# Patient Record
Sex: Female | Born: 1990 | Hispanic: No | Marital: Single | Smoking: Former smoker
Health system: Southern US, Community
[De-identification: ages and names within clinical notes are randomized; demographics above are authoritative.]

## PROBLEM LIST (undated history)

## (undated) ENCOUNTER — Ambulatory Visit (HOSPITAL_BASED_OUTPATIENT_CLINIC_OR_DEPARTMENT_OTHER): Payer: Self-pay

## (undated) ENCOUNTER — Inpatient Hospital Stay (HOSPITAL_COMMUNITY): Payer: Self-pay

## (undated) DIAGNOSIS — O24419 Gestational diabetes mellitus in pregnancy, unspecified control: Secondary | ICD-10-CM

## (undated) HISTORY — PX: KNEE SURGERY: SHX244

---
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
## 2014-10-02 ENCOUNTER — Encounter (HOSPITAL_COMMUNITY): Payer: Self-pay | Admitting: *Deleted

## 2014-10-02 ENCOUNTER — Emergency Department (HOSPITAL_COMMUNITY)
Admission: EM | Admit: 2014-10-02 | Discharge: 2014-10-02 | Disposition: A | Discharge status: Home / Self Care | Payer: Self-pay | Attending: Emergency Medicine | Admitting: Emergency Medicine

## 2014-10-02 DIAGNOSIS — M778 Other enthesopathies, not elsewhere classified: Secondary | ICD-10-CM | POA: Insufficient documentation

## 2014-10-02 MED ORDER — IBUPROFEN 800 MG PO TABS
800.0000 mg | ORAL_TABLET | Freq: Three times a day (TID) | ORAL | Status: DC
Start: 2014-10-02 — End: 2015-07-24

## 2014-10-02 NOTE — ED Provider Notes (Signed)
CSN: [id number]     Arrival date & time 10/02/14  1301 History  This chart was scribed for Cheron SchaumannLeslie Adana Marik, PA-C, working with Toy CookeyMegan Docherty, MD by Chestine SporeSoijett Blue, ED Scribe. The patient was seen in room TR05C/TR05C at 2:00 PM.    Chief Complaint  Patient presents with  . Hand Pain    The history is provided by the patient. No language interpreter was used.    HPI Comments: Savannah Marsh is a [age] y.o. female who presents to the Emergency Department complaining of worsening right hand pain onset 3 weeks. She thinks that it may be carpal tunnel. She reports that she is having the most pain in her thumb. She states that she is having associated symptoms of joint swelling. She denies any other symptoms. 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 She reports that she is generally healthy and she is not on any medications. She denies being allergic to any medications. She denies smoking or drinking.    History reviewed. No pertinent past medical history. History reviewed. No pertinent past surgical history. History reviewed. No pertinent family history. History  Substance Use Topics  . Smoking status: Not on file  . Smokeless tobacco: Not on file  . Alcohol Use: No   OB History    No data available     Review of Systems  All other systems reviewed and are negative.     Allergies  Review of patient's allergies indicates no known allergies.  Home Medications   Prior to Admission medications   Not on File   LMP 09/21/2014 Physical Exam  Constitutional: She is oriented to person, place, and time. She appears well-developed and well-nourished. No distress.  HENT:  Head: Normocephalic and atraumatic.  Eyes: EOM are normal.  Neck: Neck supple. No tracheal deviation present.  Cardiovascular: Normal rate.   Pulmonary/Chest: Effort normal. No respiratory distress.  Musculoskeletal: Normal range of motion.  Right hand: Swollen thenar prominence. Pain with loading of thumb. Pain with ROM of brachial  radialis.   Neurological: She is alert and oriented to person, place, and time.  Skin: Skin is warm and dry.  Psychiatric: She has a normal mood and affect. Her behavior is normal.  Nursing note and vitals reviewed.   ED Course  Procedures (including critical care time) COORDINATION OF CARE: 2:01 PM-Discussed treatment plan which includes thumb spica, anti-inflammatory medication, referral to orthopedic with pt at bedside and pt agreed to plan.   Labs Review Labs Reviewed - No data to display  Imaging Review No results found.   EKG Interpretation None      MDM   Final diagnoses:  Tendonitis of wrist, right    Wrist splint Ibuprofen  Follow up with Dr. Izora Ribasoley in 1 week if pain persist I personally performed the services described in this documentation, which was scribed in my presence. The recorded information has been reviewed and is accurate.    Lonia SkinnerLeslie K Salineno North[place], PA-C 10/02/14 1428  Toy CookeyMegan Docherty, MD 10/05/14 1004

## 2014-10-02 NOTE — Discharge Instructions (Signed)
Thumb Sprain °Your exam shows you have a sprained thumb. This means the ligaments around the joint have been torn. Thumb sprains usually take 3-6 weeks to heal. However, severe, unstable sprains may need to be fixed surgically. Sometimes a small piece of bone is pulled off by the ligament. If this is not treated properly, a sprained thumb can lead to a painful, weak joint. Treatment helps reduce pain and shortens the period of disability. °The thumb, and often the wrist, must remain splinted for the first 2-4 weeks to protect the joint. Keep your hand elevated and apply ice packs frequently to the injured area (20-30 minutes every 2-3 hours) for the next 2-4 days. This helps reduce swelling and control pain. Pain medicine may also be used for several days. Motion and strengthening exercises may later be prescribed for the joint to return to normal function. Be sure to see your doctor for follow-up because your thumb joint may require further support with splints, bandages or tape. Please see your doctor or go to the emergency room right away if you have increased pain despite proper treatment, or a numb, cold, or pale thumb. °Document Released: 09/14/2004 Document Revised: 10/30/2011 Document Reviewed: 08/08/2008 °ExitCare® Patient Information ©2015 [employer]. This information is not intended to replace advice given to you by your health care provider. Make sure you discuss any questions you have with your health care provider. ° °

## 2014-10-02 NOTE — ED Notes (Signed)
Pt reports having pain to right hand for extended amount of time, thought it was carpal tunnel. Denies injury.

## 2015-07-24 ENCOUNTER — Encounter (HOSPITAL_COMMUNITY): Payer: Self-pay | Admitting: *Deleted

## 2015-07-24 ENCOUNTER — Inpatient Hospital Stay (HOSPITAL_COMMUNITY): Payer: Medicaid Other

## 2015-07-24 ENCOUNTER — Inpatient Hospital Stay (HOSPITAL_COMMUNITY)
Admission: AD | Admit: 2015-07-24 | Discharge: 2015-07-24 | Disposition: A | Discharge status: Home / Self Care | Payer: Medicaid Other | Source: Ambulatory Visit | Attending: Obstetrics & Gynecology | Admitting: Obstetrics & Gynecology

## 2015-07-24 DIAGNOSIS — O3680X0 Pregnancy with inconclusive fetal viability, not applicable or unspecified: Secondary | ICD-10-CM

## 2015-07-24 DIAGNOSIS — R1032 Left lower quadrant pain: Secondary | ICD-10-CM | POA: Insufficient documentation

## 2015-07-24 DIAGNOSIS — R109 Unspecified abdominal pain: Secondary | ICD-10-CM | POA: Diagnosis not present

## 2015-07-24 DIAGNOSIS — Z87891 Personal history of nicotine dependence: Secondary | ICD-10-CM | POA: Insufficient documentation

## 2015-07-24 DIAGNOSIS — O9989 Other specified diseases and conditions complicating pregnancy, childbirth and the puerperium: Secondary | ICD-10-CM | POA: Diagnosis not present

## 2015-07-24 DIAGNOSIS — Z3A01 Less than 8 weeks gestation of pregnancy: Secondary | ICD-10-CM | POA: Insufficient documentation

## 2015-07-24 DIAGNOSIS — O219 Vomiting of pregnancy, unspecified: Secondary | ICD-10-CM | POA: Diagnosis not present

## 2015-07-24 LAB — URINALYSIS, ROUTINE W REFLEX MICROSCOPIC
Bilirubin Urine: NEGATIVE
Glucose, UA: NEGATIVE mg/dL
Hgb urine dipstick: NEGATIVE
Ketones, ur: NEGATIVE mg/dL
Leukocytes, UA: NEGATIVE
Nitrite: NEGATIVE
Protein, ur: 100 mg/dL — AB
Specific Gravity, Urine: >1.03 — ABNORMAL HIGH (ref 1.005–1.030)
pH: 6 (ref 5.0–8.0)

## 2015-07-24 LAB — CBC
HCT: 40.2 % (ref 36.0–46.0)
Hemoglobin: 13.2 g/dL (ref 12.0–15.0)
MCH: 26.4 pg (ref 26.0–34.0)
MCHC: 32.8 g/dL (ref 30.0–36.0)
MCV: 80.4 fL (ref 78.0–100.0)
Platelets: 351 10*3/uL (ref 150–400)
RBC: 5 MIL/uL (ref 3.87–5.11)
RDW: 13.9 % (ref 11.5–15.5)
WBC: 11.1 10*3/uL — ABNORMAL HIGH (ref 4.0–10.5)

## 2015-07-24 LAB — URINE MICROSCOPIC-ADD ON

## 2015-07-24 LAB — HCG, QUANTITATIVE, PREGNANCY: hCG, Beta Chain, Quant, S: 14631 m[IU]/mL — ABNORMAL HIGH (ref <5)

## 2015-07-24 LAB — ABO/RH: ABO/RH(D): A POS

## 2015-07-24 LAB — POCT PREGNANCY, URINE: Preg Test, Ur: POSITIVE — AB

## 2015-07-24 MED ORDER — PROMETHAZINE HCL 25 MG PO TABS
25.0000 mg | ORAL_TABLET | Freq: Once | ORAL | Status: AC
  Administered 2015-07-24: 25 mg via ORAL
  Filled 2015-07-24: qty 1

## 2015-07-24 MED ORDER — PROMETHAZINE HCL 25 MG PO TABS: 25.0000 mg | ORAL_TABLET | Freq: Four times a day (QID) | ORAL | Status: DC | PRN

## 2015-07-24 NOTE — MAU Note (Addendum)
Threw up, was yellow and noted some blood.  Has been having some stomach cramps, took Tylenol and the cramps seem to have gotten worse.  +[facility] week ago[facility]

## 2015-07-24 NOTE — MAU Provider Note (Signed)
History     CSN: [id number]  Arrival date and time: 07/24/15 1500   First Provider Initiated Contact with Patient 07/24/15 1610      Chief Complaint  Patient presents with  . Abdominal Cramping  . Emesis  . Possible Pregnancy   HPI  Savannah Marsh 24 y.o.  Presents to the MAU with abdominal pain on her left lower side and nausea. Denies vaginal bleeding. Positive pregnancy test last week  History reviewed. No pertinent past medical history.  Past Surgical History  Procedure Laterality Date  . Knee surgery      History reviewed. No pertinent family history.  Social History  Substance Use Topics  . Smoking status: Former Smoker    Quit date: 07/16/2015  . Smokeless tobacco: None  . Alcohol Use: No    Allergies: No Known Allergies  Prescriptions prior to admission  Medication Sig Dispense Refill Last Dose  . acetaminophen (TYLENOL) 500 MG tablet Take 500 mg by mouth every 6 (six) hours as needed for mild pain or headache.   07/24/2015 at 1415    Review of Systems  Gastrointestinal: Positive for nausea and abdominal pain.  All other systems reviewed and are negative.  Physical Exam   Blood pressure 121/69, pulse 70, temperature 99.5 F (37.5 C), temperature source Oral, resp. rate 18, last menstrual period 06/11/2015.  Physical Exam  Nursing note and vitals reviewed. Constitutional: She is oriented to person, place, and time. She appears well-developed and well-nourished. No distress.  HENT:  Head: Normocephalic and atraumatic.  Neck: Normal range of motion.  Cardiovascular: Normal rate and regular rhythm.   Respiratory: Effort normal and breath sounds normal. No respiratory distress.  GI: Soft. There is no tenderness.  Musculoskeletal: Normal range of motion.  Neurological: She is alert and oriented to person, place, and time.  Skin: Skin is warm and dry.  Psychiatric: She has a normal mood and affect. Her behavior is normal. Judgment and thought content  normal.   Results for orders placed or performed during the hospital encounter of 07/24/15 (from the past 24 hour(s))  Urinalysis, Routine w reflex microscopic (not at [facility])     Status: Abnormal   Collection Time: 07/24/15  3:25 PM  Result Value Ref Range   Color, Urine YELLOW YELLOW   APPearance CLEAR CLEAR   Specific Gravity, Urine >1.030 (H) 1.005 - 1.030   pH 6.0 5.0 - 8.0   Glucose, UA NEGATIVE NEGATIVE mg/dL   Hgb urine dipstick NEGATIVE NEGATIVE   Bilirubin Urine NEGATIVE NEGATIVE   Ketones, ur NEGATIVE NEGATIVE mg/dL   Protein, ur 147100 (A) NEGATIVE mg/dL   Nitrite NEGATIVE NEGATIVE   Leukocytes, UA NEGATIVE NEGATIVE  Urine microscopic-add on     Status: Abnormal   Collection Time: 07/24/15  3:25 PM  Result Value Ref Range   Squamous Epithelial / LPF 0-5 (A) NONE SEEN   WBC, UA 0-5 0 - 5 WBC/hpf   RBC / HPF 0-5 0 - 5 RBC/hpf   Bacteria, UA FEW (A) NONE SEEN   Urine-Other MUCOUS PRESENT   Pregnancy, urine POC     Status: Abnormal   Collection Time: 07/24/15  4:00 PM  Result Value Ref Range   Preg Test, Ur POSITIVE (A) NEGATIVE  CBC     Status: Abnormal   Collection Time: 07/24/15  4:23 PM  Result Value Ref Range   WBC 11.1 (H) 4.0 - 10.5 K/uL   RBC 5.00 3.87 - 5.11 MIL/uL   Hemoglobin 13.2 12.0 -  15.0 g/dL   HCT 16.1 09.6 - 04.5 %   MCV 80.4 78.0 - 100.0 fL   MCH 26.4 26.0 - 34.0 pg   MCHC 32.8 30.0 - 36.0 g/dL   RDW 40.9 81.1 - 91.4 %   Platelets 351 150 - 400 K/uL  hCG, quantitative, pregnancy     Status: Abnormal   Collection Time: 07/24/15  4:23 PM  Result Value Ref Range   hCG, Beta Chain, Quant, S 14631 (H) <5 mIU/mL  ABO/Rh     Status: None (Preliminary result)   Collection Time: 07/24/15  4:23 PM  Result Value Ref Range   ABO/RH(D) A POS     MAU Course  Procedures  MDM Pt expereincing nausea and vomiting and left sided abdominal pain. Will evaluate [place]; Positive FHT's-108.   Assessment and Plan  Pregnancy of unknown location Nausea and vomiting  of pregnancy Rx: Phenergan Discharge to home [facility] [place] 07/24/2015, 4:11 PM

## 2015-07-24 NOTE — Discharge Instructions (Signed)
Prenatal Care [facility]    [facility]  [facility]  [phone]     Phone: [phone]          [facility]                      [facility]   [place]     Phone: [phone]  Phone: [phone]         [facility]     Phone: [phone]  Phone: [phone]           [facility] [place]                hone: [phone]  Phone: [phone]         [facility] [address] Ceo      Phone: [phone]  Phone: [phone]         [facility] Dept.                Phone: [phone]  [facility]   [phone]    Family [address] [place])          Phone: [phone] [facility]   Phone: [phone] Medications in Pregnancy   Acne: Benzoyl Peroxide Salicylic Acid  Backache/Headache: Tylenol: 2 regular strength every 4 hours OR              2 Extra strength every 6 hours  Colds/Coughs/Allergies: Benadryl (alcohol free) 25 mg every 6 hours as needed Breath right strips Claritin Cepacol throat lozenges Chloraseptic throat spray Cold-Eeze- up to three times per day Cough drops, alcohol free Flonase (by prescription only) Guaifenesin Mucinex Robitussin DM (plain only, alcohol free) Saline nasal spray/drops Sudafed (pseudoephedrine) & Actifed ** use only after [redacted] weeks gestation and if you do not have high blood pressure Tylenol Vicks Vaporub Zinc lozenges Zyrtec   Constipation: Colace Ducolax suppositories Fleet enema Glycerin suppositories Metamucil Milk of magnesia Miralax Senokot Smooth move tea  Diarrhea: Kaopectate Imodium A-D  *NO pepto Bismol  Hemorrhoids: Anusol Anusol HC Preparation H Tucks  Indigestion: Tums Maalox Mylanta Zantac  Pepcid  Insomnia: Benadryl (alcohol free)  every 6 hours as needed Tylenol PM Unisom,  no Gelcaps  Leg Cramps: Tums MagGel  Nausea/Vomiting:  Bonine Dramamine Emetrol Ginger extract Sea bands Meclizine  Nausea medication to take during pregnancy:  Unisom (doxylamine succinate 25 mg tablets) Take one tablet daily at bedtime. If symptoms are not adequately controlled, the dose can be increased to a maximum recommended dose of two tablets daily (1/2 tablet in the morning, 1/2 tablet mid-afternoon and one at bedtime). Vitamin B6  tablets. Take one tablet twice a day (up to 200 mg per day).  Skin Rashes: Aveeno products Benadryl cream or  every 6 hours as needed Calamine Lotion 1% cortisone cream  Yeast infection: Gyne-lotrimin 7 Monistat 7   **If taking multiple medications, please check labels to avoid duplicating the same active ingredients **take medication as directed on the label ** Do not exceed 4000 mg of tylenol in 24 hours **Do not take medications that contain aspirin or ibuprofen   Morning Sickness Morning sickness is when you feel sick to your stomach (nauseous) during pregnancy. This nauseous feeling may or may not come with vomiting. It often occurs in the morning but can be a problem any time of day. Morning sickness is most common  during the first trimester, but it may continue throughout pregnancy. While morning sickness is unpleasant, it is usually harmless unless you develop severe and continual vomiting (hyperemesis gravidarum). This condition requires more intense treatment.  CAUSES  The cause of morning sickness is not completely known but seems to be related to normal hormonal changes that occur in pregnancy. RISK FACTORS You are at greater risk if you:  Experienced nausea or vomiting before your pregnancy.  Had morning sickness during a previous pregnancy.  Are pregnant with more than one baby, such as twins. TREATMENT  Do not use any medicines (prescription, over-the-counter, or herbal) for morning sickness without first  talking to your health care provider. Your health care provider may prescribe or recommend:  Vitamin B6 supplements.  Anti-nausea medicines.  The herbal medicine ginger. HOME CARE INSTRUCTIONS   Only take over-the-counter or prescription medicines as directed by your health care provider.  Taking multivitamins before getting pregnant can prevent or decrease the severity of morning sickness in most women.  Eat a piece of dry toast or unsalted crackers before getting out of bed in the morning.  Eat five or six small meals a day.  Eat dry and bland foods (rice, baked potato). Foods high in carbohydrates are often helpful.  Do not drink liquids with your meals. Drink liquids between meals.  Avoid greasy, fatty, and spicy foods.  Get someone to cook for you if the smell of any food causes nausea and vomiting.  If you feel nauseous after taking prenatal vitamins, take the vitamins at night or with a snack.  Snack on protein foods (nuts, yogurt, cheese) between meals if you are hungry.  Eat unsweetened gelatins for desserts.  Wearing an acupressure wristband (worn for sea sickness) may be helpful.  Acupuncture may be helpful.  Do not smoke.  Get a humidifier to keep the air in your house free of odors.  Get plenty of fresh air. SEEK MEDICAL CARE IF:   Your home remedies are not working, and you need medicine.  You feel dizzy or lightheaded.  You are losing weight. SEEK IMMEDIATE MEDICAL CARE IF:   You have persistent and uncontrolled nausea and vomiting.  You pass out (faint). MAKE SURE YOU:  Understand these instructions.  Will watch your condition.  Will get help right away if you are not doing well or get worse.   This information is not intended to replace advice given to you by your health care provider. Make sure you discuss any questions you have with your health care provider.   Document Released: 09/28/2006 Document Revised: 08/12/2013 Document  Reviewed: 01/22/2013 Elsevier Interactive Patient Education [employer]2016 Elsevier Inc.

## 2015-07-27 ENCOUNTER — Inpatient Hospital Stay (HOSPITAL_COMMUNITY)
Admission: AD | Admit: 2015-07-27 | Discharge: 2015-07-27 | Disposition: A | Discharge status: Home / Self Care | Payer: Medicaid Other | Source: Ambulatory Visit | Attending: Family Medicine | Admitting: Family Medicine

## 2015-07-27 ENCOUNTER — Encounter (HOSPITAL_COMMUNITY): Payer: Self-pay

## 2015-07-27 DIAGNOSIS — N390 Urinary tract infection, site not specified: Secondary | ICD-10-CM

## 2015-07-27 DIAGNOSIS — O209 Hemorrhage in early pregnancy, unspecified: Secondary | ICD-10-CM | POA: Diagnosis not present

## 2015-07-27 DIAGNOSIS — O2341 Unspecified infection of urinary tract in pregnancy, first trimester: Secondary | ICD-10-CM | POA: Diagnosis not present

## 2015-07-27 DIAGNOSIS — Z3A01 Less than 8 weeks gestation of pregnancy: Secondary | ICD-10-CM | POA: Diagnosis not present

## 2015-07-27 DIAGNOSIS — Z87891 Personal history of nicotine dependence: Secondary | ICD-10-CM | POA: Diagnosis not present

## 2015-07-27 DIAGNOSIS — R109 Unspecified abdominal pain: Secondary | ICD-10-CM | POA: Diagnosis present

## 2015-07-27 DIAGNOSIS — O26851 Spotting complicating pregnancy, first trimester: Secondary | ICD-10-CM

## 2015-07-27 LAB — URINE MICROSCOPIC-ADD ON
RBC / HPF: NONE SEEN RBC/hpf (ref 0–5)
WBC, UA: NONE SEEN WBC/hpf (ref 0–5)

## 2015-07-27 LAB — URINALYSIS, ROUTINE W REFLEX MICROSCOPIC
Bilirubin Urine: NEGATIVE
Glucose, UA: NEGATIVE mg/dL
Hgb urine dipstick: NEGATIVE
Ketones, ur: 15 mg/dL — AB
Leukocytes, UA: NEGATIVE
Nitrite: NEGATIVE
Protein, ur: 30 mg/dL — AB
Specific Gravity, Urine: 1.02 (ref 1.005–1.030)
pH: 6.5 (ref 5.0–8.0)

## 2015-07-27 MED ORDER — NITROFURANTOIN MONOHYD MACRO 100 MG PO CAPS: 100.0000 mg | ORAL_CAPSULE | Freq: Two times a day (BID) | ORAL | Status: AC

## 2015-07-27 NOTE — MAU Provider Note (Signed)
History     CSN: [id number]  Arrival date and time: 07/27/15 1431   First Provider Initiated Contact with Patient 07/27/15 1537      Chief Complaint  Patient presents with  . Vaginal Bleeding  . Abdominal Pain   HPI  Savannah Marsh 24 y.o. G1P0 [id number] presents to the MAU with c/o seeing some blood one time when she went to the bathroom and wiped. She denies any abdominal pain. Pt has confirmed IUP. She states she was just nervous and wanted to be checked out.  History reviewed. No pertinent past medical history.  Past Surgical History  Procedure Laterality Date  . Knee surgery      History reviewed. No pertinent family history.  Social History  Substance Use Topics  . Smoking status: Former Smoker    Quit date: 07/16/2015  . Smokeless tobacco: None  . Alcohol Use: No    Allergies: No Known Allergies  Prescriptions prior to admission  Medication Sig Dispense Refill Last Dose  . acetaminophen (TYLENOL) 500 MG tablet Take 500 mg by mouth every 6 (six) hours as needed for mild pain or headache.   07/24/2015 at 1415  . promethazine (PHENERGAN) 25 MG tablet Take 1 tablet (25 mg total) by mouth every 6 (six) hours as needed for nausea or vomiting. 30 tablet 0     Review of Systems  Constitutional: Negative for fever.  Genitourinary:       Small vaginal bleeding  All other systems reviewed and are negative.  Physical Exam   Blood pressure 138/75, pulse 76, temperature 98.5 F (36.9 C), temperature source Oral, resp. rate 18, last menstrual period 06/11/2015.  Physical Exam  Nursing note and vitals reviewed. Constitutional: She is oriented to person, place, and time. She appears well-developed and well-nourished.  HENT:  Head: Normocephalic and atraumatic.  Neck: Normal range of motion.  Cardiovascular: Normal rate and regular rhythm.   Respiratory: Effort normal and breath sounds normal. No respiratory distress.  GI: Soft.  Musculoskeletal: Normal range of motion.   Neurological: She is alert and oriented to person, place, and time.  Skin: Skin is warm and dry.  Psychiatric: She has a normal mood and affect. Her behavior is normal. Judgment and thought content normal.   Results for orders placed or performed during the hospital encounter of 07/27/15 (from the past 24 hour(s))  Urinalysis, Routine w reflex microscopic (not [facility])     Status: Abnormal   Collection Time: 07/27/15  2:53 PM  Result Value Ref Range   Color, Urine YELLOW YELLOW   APPearance CLEAR CLEAR   Specific Gravity, Urine 1.020 1.005 - 1.030   pH 6.5 5.0 - 8.0   Glucose, UA NEGATIVE NEGATIVE mg/dL   Hgb urine dipstick NEGATIVE NEGATIVE   Bilirubin Urine NEGATIVE NEGATIVE   Ketones, ur 15 (A) NEGATIVE mg/dL   Protein, ur 30 (A) NEGATIVE mg/dL   Nitrite NEGATIVE NEGATIVE   Leukocytes, UA NEGATIVE NEGATIVE  Urine microscopic-add on     Status: Abnormal   Collection Time: 07/27/15  2:53 PM  Result Value Ref Range   Squamous Epithelial / LPF 0-5 (A) NONE SEEN   WBC, UA NONE SEEN 0 - 5 WBC/hpf   RBC / HPF NONE SEEN 0 - 5 RBC/hpf   Bacteria, UA RARE (A) NONE SEEN    MAU Course  Procedures  MDM Will treat pt for suspected UTI. Will send urine off to culture. Macrobid 100mg  bid x 7 d. Bleeding precautions given.  Assessment and Plan  Vaginal Bleeding UTI  Macrobid  BID x 7 d  Discharge to home  [facility] 07/27/2015, 3:51 PM

## 2015-07-27 NOTE — Discharge Instructions (Signed)
Pregnancy and Urinary Tract Infection °A urinary tract infection (UTI) is a bacterial infection of the urinary tract. Infection of the urinary tract can include the ureters, kidneys (pyelonephritis), bladder (cystitis), and urethra (urethritis). All pregnant women should be screened for bacteria in the urinary tract. Identifying and treating a UTI will decrease the risk of preterm labor and developing more serious infections in both the mother and baby. °CAUSES °Bacteria germs cause almost all UTIs.  °RISK FACTORS °Many factors can increase your chances of getting a UTI during pregnancy. These include: °· Having a short urethra. °· Poor toilet and hygiene habits. °· Sexual intercourse. °· Blockage of urine along the urinary tract. °· Problems with the pelvic muscles or nerves. °· Diabetes. °· Obesity. °· Bladder problems after having several children. °· Previous history of UTI. °SIGNS AND SYMPTOMS  °· Pain, burning, or a stinging feeling when urinating. °· Suddenly feeling the need to urinate right away (urgency). °· Loss of bladder control (urinary incontinence). °· Frequent urination, more than is common with pregnancy. °· Lower abdominal or back discomfort. °· Cloudy urine. °· Blood in the urine (hematuria). °· Fever.  °When the kidneys are infected, the symptoms may be: °· Back pain. °· Flank pain on the right side more so than the left. °· Fever. °· Chills. °· Nausea. °· Vomiting. °DIAGNOSIS  °A urinary tract infection is usually diagnosed through urine tests. Additional tests and procedures are sometimes done. These may include: °· Ultrasound exam of the kidneys, ureters, bladder, and urethra. °· Looking in the bladder with a lighted tube (cystoscopy). °TREATMENT °Typically, UTIs can be treated with antibiotic medicines.  °HOME CARE INSTRUCTIONS  °· Only take over-the-counter or prescription medicines as directed by your health care provider. If you were prescribed antibiotics, take them as directed. Finish  them even if you start to feel better. °· Drink enough fluids to keep your urine clear or pale yellow. °· Do not have sexual intercourse until the infection is gone and your health care provider says it is okay. °· Make sure you are tested for UTIs throughout your pregnancy. These infections often come back.  °Preventing a UTI in the Future °· Practice good toilet habits. Always wipe from front to back. Use the tissue only once. °· Do not hold your urine. Empty your bladder as soon as possible when the urge comes. °· Do not douche or use deodorant sprays. °· Wash with soap and warm water around the genital area and the anus. °· Empty your bladder before and after sexual intercourse. °· Wear underwear with a cotton crotch. °· Avoid caffeine and carbonated drinks. They can irritate the bladder. °· Drink cranberry juice or take cranberry pills. This may decrease the risk of getting a UTI. °· Do not drink alcohol. °· Keep all your appointments and tests as scheduled.  °SEEK MEDICAL CARE IF:  °· Your symptoms get worse. °· You are still having fevers 2 or more days after treatment begins. °· You have a rash. °· You feel that you are having problems with medicines prescribed. °· You have abnormal vaginal discharge. °SEEK IMMEDIATE MEDICAL CARE IF:  °· You have back or flank pain. °· You have chills. °· You have blood in your urine. °· You have nausea and vomiting. °· You have contractions of your uterus. °· You have a gush of fluid from the vagina. °MAKE SURE YOU: °· Understand these instructions.   °· Will watch your condition.   °· Will get help right away if you are not doing   well or get worse.   °  °This information is not intended to replace advice given to you by your health care provider. Make sure you discuss any questions you have with your health care provider. °  °Document Released: 12/02/2010 Document Revised: 05/28/2013 Document Reviewed: 03/06/2013 °Elsevier Interactive Patient Education ©2016 Elsevier  Inc. ° ° °Vaginal Bleeding During Pregnancy, First Trimester °A small amount of bleeding (spotting) from the vagina is relatively common in early pregnancy. It usually stops on its own. Various things may cause bleeding or spotting in early pregnancy. Some bleeding may be related to the pregnancy, and some may not. In most cases, the bleeding is normal and is not a problem. However, bleeding can also be a sign of something serious. Be sure to tell your health care provider about any vaginal bleeding right away. °Some possible causes of vaginal bleeding during the first trimester include: °· Infection or inflammation of the cervix. °· Growths (polyps) on the cervix. °· Miscarriage or threatened miscarriage. °· Pregnancy tissue has developed outside of the uterus and in a fallopian tube (tubal pregnancy). °· Tiny cysts have developed in the uterus instead of pregnancy tissue (molar pregnancy). °HOME CARE INSTRUCTIONS  °Watch your condition for any changes. The following actions may help to lessen any discomfort you are feeling: °· Follow your health care provider's instructions for limiting your activity. If your health care provider orders bed rest, you may need to stay in bed and only get up to use the bathroom. However, your health care provider may allow you to continue light activity. °· If needed, make plans for someone to help with your regular activities and responsibilities while you are on bed rest. °· Keep track of the number of pads you use each day, how often you change pads, and how soaked (saturated) they are. Write this down. °· Do not use tampons. Do not douche. °· Do not have sexual intercourse or orgasms until approved by your health care provider. °· If you pass any tissue from your vagina, save the tissue so you can show it to your health care provider. °· Only take over-the-counter or prescription medicines as directed by your health care provider. °· Do not take aspirin because it can make you  bleed. °· Keep all follow-up appointments as directed by your health care provider. °SEEK MEDICAL CARE IF: °· You have any vaginal bleeding during any part of your pregnancy. °· You have cramps or labor pains. °· You have a fever, not controlled by medicine. °SEEK IMMEDIATE MEDICAL CARE IF:  °· You have severe cramps in your back or belly (abdomen). °· You pass large clots or tissue from your vagina. °· Your bleeding increases. °· You feel light-headed or weak, or you have fainting episodes. °· You have chills. °· You are leaking fluid or have a gush of fluid from your vagina. °· You pass out while having a bowel movement. °MAKE SURE YOU: °· Understand these instructions. °· Will watch your condition. °· Will get help right away if you are not doing well or get worse. °  °This information is not intended to replace advice given to you by your health care provider. Make sure you discuss any questions you have with your health care provider. °  °Document Released: 05/17/2005 Document Revised: 08/12/2013 Document Reviewed: 04/14/2013 °Elsevier Interactive Patient Education ©2016 Elsevier Inc. ° °

## 2015-07-27 NOTE — MAU Note (Signed)
Pt saw blood with wiping today, having LLQ pain.

## 2015-07-29 LAB — CULTURE, OB URINE
Culture: NO GROWTH
Special Requests: NORMAL

## 2015-08-16 ENCOUNTER — Encounter (HOSPITAL_COMMUNITY): Payer: Self-pay | Admitting: *Deleted

## 2015-08-16 ENCOUNTER — Emergency Department (HOSPITAL_COMMUNITY)
Admission: EM | Admit: 2015-08-16 | Discharge: 2015-08-16 | Disposition: A | Discharge status: Home / Self Care | Payer: Medicaid Other | Attending: Emergency Medicine | Admitting: Emergency Medicine

## 2015-08-16 DIAGNOSIS — Z87891 Personal history of nicotine dependence: Secondary | ICD-10-CM | POA: Diagnosis not present

## 2015-08-16 DIAGNOSIS — O209 Hemorrhage in early pregnancy, unspecified: Secondary | ICD-10-CM | POA: Diagnosis present

## 2015-08-16 DIAGNOSIS — O23511 Infections of cervix in pregnancy, first trimester: Secondary | ICD-10-CM | POA: Insufficient documentation

## 2015-08-16 DIAGNOSIS — N72 Inflammatory disease of cervix uteri: Secondary | ICD-10-CM

## 2015-08-16 DIAGNOSIS — O200 Threatened abortion: Secondary | ICD-10-CM | POA: Insufficient documentation

## 2015-08-16 LAB — URINALYSIS, ROUTINE W REFLEX MICROSCOPIC
Bilirubin Urine: NEGATIVE
GLUCOSE, UA: NEGATIVE mg/dL
Hgb urine dipstick: NEGATIVE
KETONES UR: 40 mg/dL — AB
LEUKOCYTES UA: NEGATIVE
NITRITE: NEGATIVE
PH: 6 (ref 5.0–8.0)
PROTEIN: 100 mg/dL — AB
Specific Gravity, Urine: 1.022 (ref 1.005–1.030)

## 2015-08-16 LAB — ABO/RH: ABO/RH(D): A POS

## 2015-08-16 LAB — URINE MICROSCOPIC-ADD ON

## 2015-08-16 LAB — HCG, QUANTITATIVE, PREGNANCY: HCG, BETA CHAIN, QUANT, S: 88402 m[IU]/mL — AB (ref <5)

## 2015-08-16 LAB — WET PREP, GENITAL
Clue Cells Wet Prep HPF POC: NONE SEEN
Sperm: NONE SEEN
Trich, Wet Prep: NONE SEEN
YEAST WET PREP: NONE SEEN

## 2015-08-16 MED ORDER — LIDOCAINE HCL (PF) 1 % IJ SOLN
2.0000 mL | Freq: Once | INTRAMUSCULAR | Status: AC
  Administered 2015-08-16: 2 mL

## 2015-08-16 MED ORDER — AZITHROMYCIN 250 MG PO TABS
1000.0000 mg | ORAL_TABLET | Freq: Once | ORAL | Status: AC
  Administered 2015-08-16: 1000 mg via ORAL
  Filled 2015-08-16: qty 4

## 2015-08-16 MED ORDER — CEFTRIAXONE SODIUM 250 MG IJ SOLR
250.0000 mg | Freq: Once | INTRAMUSCULAR | Status: AC
  Administered 2015-08-16: 250 mg via INTRAMUSCULAR
  Filled 2015-08-16: qty 250

## 2015-08-16 MED ORDER — LIDOCAINE HCL (PF) 1 % IJ SOLN
INTRAMUSCULAR | Status: AC
  Filled 2015-08-16: qty 5

## 2015-08-16 NOTE — ED Provider Notes (Signed)
CSN: [id number]     Arrival date & time 08/16/15  4098 History   First MD Initiated Contact with Patient 08/16/15 1106     Chief Complaint  Patient presents with  . Vaginal Bleeding     (Consider location/radiation/quality/duration/timing/severity/associated sxs/prior Treatment) HPI   24 year old female who is currently [redacted] weeks pregnant presents with vaginal bleeding. Patient reports yesterday she noticed some mild vaginal spotting with mild abdominal cramping. This morning she continues to have vaginal bleeding and abdominal cramping which concerns her. She mentioned her bleeding is scant. She does report mild lightheadedness. She did had another episode of vaginal bleeding 5 weeks ago in which [facility] Patient reports she had an ultrasound performed and no evidence of ectopic pregnancy. She was reassured that bleeding during the first trimester is not unusual. However, patient would like to be reevaluated. She also notice a painful "knot" on her left breast yesterday, coincide with abdominal cramping. This is the first pregnancy. She denies any recent injury. No fever, back pain, dysuria, hematuria, or rash. Denies any vaginal discharge.  History reviewed. No pertinent past medical history. Past Surgical History  Procedure Laterality Date  . Knee surgery     No family history on file. Social History  Substance Use Topics  . Smoking status: Former Smoker    Quit date: 07/16/2015  . Smokeless tobacco: None  . Alcohol Use: No   OB History    Gravida Para Term Preterm AB TAB SAB Ectopic Multiple Living   1              Review of Systems  All other systems reviewed and are negative.     Allergies  Review of patient's allergies indicates no known allergies.  Home Medications   Prior to Admission medications   Medication Sig Start Date End Date Taking? Authorizing Provider  acetaminophen (TYLENOL) 500 MG tablet Take 500 mg by mouth every 6 (six) hours  as needed for mild pain or headache.    Historical Provider, MD  promethazine (PHENERGAN) 25 MG tablet Take 1 tablet (25 mg total) by mouth every 6 (six) hours as needed for nausea or vomiting. 07/24/15   Lori A Clemmons, CNM   BP 132/80 mmHg  Pulse 94  Temp(Src) 98.6 F (37 C) (Oral)  Resp 16  SpO2 100%  LMP 06/11/2015 Physical Exam  Constitutional: She appears well-developed and well-nourished. No distress.  HENT:  Head: Atraumatic.  Eyes: Conjunctivae are normal.  Neck: Neck supple.  Abdominal: Soft. There is no tenderness (mild left lower quadrant tenderness without guarding or rebound tenderness.).  Genitourinary:  Chaperone present during exam. No inguinal lymphadenopathy or inguinal hernia noted. Normal external genitalia. Mild discomfort with speculum insertion. Mild friable tissue at the cervical os which is closed. Yellow discharge noted coming from the os. No bleeding noted. On bimanual examination patient does have left adnexal tenderness but no cervical motion tenderness. Non-gravid abdomen.  Neurological: She is alert.  Skin: No rash noted.  Psychiatric: She has a normal mood and affect.  Nursing note and vitals reviewed.   ED Course  Procedures (including critical care time) Labs Review Labs Reviewed  WET PREP, GENITAL - Abnormal; Notable for the following:    WBC, Wet Prep HPF POC MANY (*)    All other components within normal limits  HCG, QUANTITATIVE, PREGNANCY - Abnormal; Notable for the following:    hCG, Beta Chain, Quant, [place] [address] (*)    All other components within  normal limits  URINALYSIS, ROUTINE W REFLEX MICROSCOPIC (NOT AT [facility]) - Abnormal; Notable for the following:    APPearance CLOUDY (*)    Ketones, ur 40 (*)    Protein, ur 100 (*)    All other components within normal limits  URINE MICROSCOPIC-ADD ON - Abnormal; Notable for the following:    Squamous Epithelial / LPF 6-30 (*)    Bacteria, UA RARE (*)    Casts HYALINE CASTS (*)    All other  components within normal limits  RPR  HIV ANTIBODY (ROUTINE TESTING)  ABO/RH  GC/CHLAMYDIA PROBE AMP ([place]) NOT AT [facility]      US OB Comp Less 14 Wks   Status: Final result       PACS Images     Show images for US OB Comp Less 14 Wks     Study Result     CLINICAL DATA: Left lower quadrant abdominal pain. Positive pregnancy test. First-trimester pregnancy.  EXAM: OBSTETRIC <14 WK US AND TRANSVAGINAL OB US  TECHNIQUE: Both transabdominal and transvaginal ultrasound examinations were performed for complete evaluation of the gestation as well as the maternal uterus, adnexal regions, and pelvic cul-de-sac. Transvaginal technique was performed to assess early pregnancy.  COMPARISON: None.  FINDINGS: Intrauterine gestational sac: Visualized/normal in shape.  Yolk sac: Present  Embryo: Present  Cardiac Activity: Present  Heart Rate: 103 bpm  CRL: 3.1 mm 5 w 6 d US EDC: 03/19/2016  Maternal uterus/adnexae: Trace free fluid is present. Uterus and adnexa are otherwise unremarkable.  IMPRESSION: 1. Single intrauterine pregnancy with an estimated gestational age of [redacted] weeks and 6 days. This is consistent with the estimated gestational age by LMP. 2. No complicating features. 3. Uterus and adnexa are otherwise unremarkable.   Electronically Signed  By: Marin Robertshristopher Mattern M.D.  On: 07/24/2015 17:50     Imaging Review No results found. I have personally reviewed and evaluated these images and lab results as part of my medical decision-making.   EKG Interpretation None      MDM   Final diagnoses:  Threatened miscarriage in early pregnancy  Acute cervicitis    BP 123/71 mmHg  Pulse 88  Temp(Src) 98.6 F (37 C) (Oral)  Resp 18  SpO2 99%  LMP 06/11/2015   11:24 AM Patient here with low abdominal cramping and vaginal bleeding during her first trimester. This is suggestive of threatened  miscarriage. Workup initiated.  3:06 PM  Wet prep demonstrated many WBC. Patient does have left adnexal tenderness and therefore suspect acute cervicitis. Rocephin and Zithromax given. Urine shows 40 ketones but no signs of UTI. HCG Quant is [id number].  Patient symptoms suggestive of threatened miscarriage secondary to acute cervicitis. I encouraged [facility] management of her condition. Recommend staying hydrated and avoid sexual activities at this time. Work note provided as requested. Return precaution discussed.  Fayrene HelperBowie Natara Monfort, PA-C 08/16/15 1510  Linwood DibblesJon Knapp, MD 08/16/15 [phone]

## 2015-08-16 NOTE — Discharge Instructions (Signed)
Cervicitis Cervicitis is a soreness and puffiness (inflammation) of the cervix.  HOME CARE  Do not have sex (intercourse) until your doctor says it is okay.  Do not have sex until your partner is treated or as told by your doctor.  Take your antibiotic medicine as told. Finish it even if you start to feel better. GET HELP IF:   Your symptoms that brought you to the doctor come back.  You have a fever. MAKE SURE YOU:   Understand these instructions.  Will watch your condition.  Will get help right away if you are not doing well or get worse.   This information is not intended to replace advice given to you by your health care provider. Make sure you discuss any questions you have with your health care provider.   Document Released: 05/16/2008 Document Revised: 08/12/2013 Document Reviewed: 01/29/2013 Elsevier Interactive Patient Education [employer]2016 Elsevier Inc.   Threatened Miscarriage A threatened miscarriage is when you have vaginal bleeding during your first 20 weeks of pregnancy but the pregnancy has not ended. Your doctor will do tests to make sure you are still pregnant. The cause of the bleeding may not be known. This condition does not mean your pregnancy will end. It does increase the risk of it ending (complete miscarriage). HOME CARE   Make sure you keep all your doctor visits for prenatal care.  Get plenty of rest.  Do not have sex or use tampons if you have vaginal bleeding.  Do not douche.  Do not smoke or use drugs.  Do not drink alcohol.  Avoid caffeine. GET HELP IF:  You have light bleeding from your vagina.  You have belly pain or cramping.  You have a fever. GET HELP RIGHT AWAY IF:   You have heavy bleeding from your vagina.  You have clots of blood coming from your vagina.  You have bad pain or cramps in your low back or belly.  You have fever, chills, and bad belly pain. MAKE SURE YOU:   Understand these instructions.  Will watch your  condition.  Will get help right away if you are not doing well or get worse.   This information is not intended to replace advice given to you by your health care provider. Make sure you discuss any questions you have with your health care provider.   Document Released: 07/20/2008 Document Revised: 08/12/2013 Document Reviewed: 06/03/2013 Elsevier Interactive Patient Education [employer]2016 Elsevier Inc.

## 2015-08-16 NOTE — ED Notes (Signed)
Pt reports vaginal bleeding since last night and being [redacted] weeks pregnant. Pt states that she has a "knot" in the center of her chest as well.

## 2015-08-17 LAB — RPR: RPR Ser Ql: NONREACTIVE

## 2015-08-17 LAB — HIV ANTIBODY (ROUTINE TESTING W REFLEX): HIV Screen 4th Generation wRfx: NONREACTIVE

## 2015-08-20 LAB — GC/CHLAMYDIA PROBE AMP (~~LOC~~) NOT AT ARMC
Chlamydia: NEGATIVE
Neisseria Gonorrhea: NEGATIVE

## 2015-08-22 NOTE — L&D Delivery Note (Signed)
Delivery Note At 3:20 AM a viable and healthy female was delivered via Vaginal, Spontaneous Delivery (Presentation: Right Occiput Anterior).  APGAR: 8, 9; weight 6 lb 12.1 oz (3065 g).   Placenta status: Intact, Spontaneous.  Cord: 3 vessels with the following complications: None.    Anesthesia: Epidural  Episiotomy: None Lacerations: 1st degree Suture Repair: 3.0 monocryl Est. Blood Loss (mL): 300  Mom to postpartum.  Baby to Couplet care / Skin to Skin.  [name] is a 25 y.o. female G1P1001 with IUP at 9179w5d admitted for IOL for preeclampsia and A2GDM.  She progressed with augmentation to complete and pushed less than 30 minutes to deliver.  Cord clamping delayed by several minutes then clamped by CNM and cut by FOB.  Placenta intact and spontaneous, bleeding minimal.  First degree laceration repaired without difficulty. Mom and baby stable prior to transfer to postpartum. She plans on breastfeeding.  [name], [name] 03/08/2016, 6:24 AM

## 2015-08-31 ENCOUNTER — Other Ambulatory Visit: Payer: Self-pay | Admitting: Certified Nurse Midwife

## 2015-08-31 ENCOUNTER — Ambulatory Visit (INDEPENDENT_AMBULATORY_CARE_PROVIDER_SITE_OTHER): Payer: Medicaid Other | Admitting: Certified Nurse Midwife

## 2015-08-31 ENCOUNTER — Encounter: Payer: Self-pay | Admitting: Certified Nurse Midwife

## 2015-08-31 VITALS — BP 123/83 | HR 96 | Temp 98.3°F | Ht 62.0 in | Wt 205.0 lb

## 2015-08-31 DIAGNOSIS — O219 Vomiting of pregnancy, unspecified: Secondary | ICD-10-CM | POA: Diagnosis not present

## 2015-08-31 DIAGNOSIS — Z3401 Encounter for supervision of normal first pregnancy, first trimester: Secondary | ICD-10-CM | POA: Diagnosis not present

## 2015-08-31 LAB — POCT URINALYSIS DIPSTICK
BILIRUBIN UA: NEGATIVE
Glucose, UA: NEGATIVE
Leukocytes, UA: NEGATIVE
NITRITE UA: NEGATIVE
RBC UA: NEGATIVE
Spec Grav, UA: 1.02
UROBILINOGEN UA: NEGATIVE
pH, UA: 5

## 2015-08-31 LAB — TSH: TSH: 1.962 u[IU]/mL (ref 0.350–4.500)

## 2015-08-31 MED ORDER — PROMETHAZINE HCL 25 MG PO TABS: 25.0000 mg | ORAL_TABLET | Freq: Four times a day (QID) | ORAL | Status: DC | PRN

## 2015-08-31 MED ORDER — VITAFOL GUMMIES 3.33-0.333-34.8 MG PO CHEW: 3.0000 | CHEWABLE_TABLET | Freq: Every day | ORAL | Status: DC

## 2015-08-31 MED ORDER — DOXYLAMINE-PYRIDOXINE 10-10 MG PO TBEC: DELAYED_RELEASE_TABLET | ORAL | Status: DC

## 2015-08-31 MED ORDER — METOCLOPRAMIDE HCL 10 MG PO TABS: 10.0000 mg | ORAL_TABLET | Freq: Three times a day (TID) | ORAL | Status: DC

## 2015-08-31 NOTE — Addendum Note (Signed)
Addended by: Henriette CombsHATTON, ANDREA L on: 08/31/2015 05:06 PM   Modules accepted: Orders

## 2015-08-31 NOTE — Progress Notes (Signed)
Subjective:    Savannah Marsh is being seen today for her first obstetrical visit.  This is not a planned pregnancy. She is at 2351w4d gestation. Her obstetrical history is significant for obesity. Relationship with FOB: significant other, living together. Patient does intend to breast feed. Pregnancy history fully reviewed.  The information documented in the HPI was reviewed and verified.  Menstrual History: OB History    Gravida Para Term Preterm AB TAB SAB Ectopic Multiple Living   1               Menarche age: 2614-25 years of age.   Patient's last menstrual period was 06/11/2015.    Past Medical History  Diagnosis Date  . Medical history non-contributory     Past Surgical History  Procedure Laterality Date  . Knee surgery       (Not in a hospital admission) No Known Allergies  Social History  Substance Use Topics  . Smoking status: Former Smoker    Quit date: 07/16/2015  . Smokeless tobacco: Never Used  . Alcohol Use: No    Family History  Problem Relation Age of Onset  . Stroke Father   . Diabetes Father      Review of Systems Constitutional: negative for weight loss Gastrointestinal: negative for vomiting Genitourinary:negative for genital lesions and vaginal discharge and dysuria Musculoskeletal:negative for back pain Behavioral/Psych: negative for abusive relationship, depression, illegal drug usage and tobacco use    Objective:    BP 123/83 mmHg  Pulse 96  Temp(Src) 98.3 F (36.8 C)  Ht 5\' 2"  (1.575 m)  Wt 205 lb (92.987 kg)  BMI 37.49 kg/m2  LMP 06/11/2015 General Appearance:    Alert, cooperative, no distress, appears stated age  Head:    Normocephalic, without obvious abnormality, atraumatic  Eyes:    PERRL, conjunctiva/corneas clear, EOM's intact, fundi    benign, both eyes  Ears:    Normal TM's and external ear canals, both ears  Nose:   Nares normal, septum midline, mucosa normal, no drainage    or sinus tenderness  Throat:   Lips, mucosa, and  tongue normal; teeth and gums normal  Neck:   Supple, symmetrical, trachea midline, no adenopathy;    thyroid:  no enlargement/tenderness/nodules; no carotid   bruit or JVD  Back:     Symmetric, no curvature, ROM normal, no CVA tenderness  Lungs:     Clear to auscultation bilaterally, respirations unlabored  Chest Wall:    No tenderness or deformity   Heart:    Regular rate and rhythm, S1 and S2 normal, no murmur, rub   or gallop  Breast Exam:    No tenderness, masses, or nipple abnormality  Abdomen:     Soft, non-tender, bowel sounds active all four quadrants,    no masses, no organomegaly  Genitalia:    Normal female without lesion, discharge or tenderness  Extremities:   Extremities normal, atraumatic, no cyanosis or edema  Pulses:   2+ and symmetric all extremities  Skin:   Skin color, texture, turgor normal, no rashes or lesions  Lymph nodes:   Cervical, supraclavicular, and axillary nodes normal  Neurologic:   CNII-XII intact, normal strength, sensation and reflexes    throughout      Lab Review Urine pregnancy test Labs reviewed yes Radiologic studies reviewed yes Assessment:    Pregnancy at 5251w4d weeks    Plan:      Prenatal vitamins.  Counseling provided regarding continued use of seat belts, cessation of  alcohol consumption, smoking or use of illicit drugs; infection precautions i.e., influenza/TDAP immunizations, toxoplasmosis,CMV, parvovirus, listeria and varicella; workplace safety, exercise during pregnancy; routine dental care, safe medications, sexual activity, hot tubs, saunas, pools, travel, caffeine use, fish and methlymercury, potential toxins, hair treatments, varicose veins Weight gain recommendations per IOM guidelines reviewed: underweight/BMI< 18.5--> gain 28 - 40 lbs; normal weight/BMI 18.5 - 24.9--> gain 25 - 35 lbs; overweight/BMI 25 - 29.9--> gain 15 - 25 lbs; obese/BMI >30->gain  11 - 20 lbs Problem list reviewed and updated. FIRST/CF mutation  testing/NIPT/QUAD SCREEN/fragile X/Ashkenazi Jewish population testing/Spinal muscular atrophy discussed: requested. Role of ultrasound in pregnancy discussed; fetal survey: requested. Amniocentesis discussed: not indicated. VBAC calculator score: VBAC consent form provided Meds ordered this encounter  Medications  . Prenatal Multivit-Min-Fe-FA (PRENATAL VITAMINS PO)    Sig: Take by mouth.   Orders Placed This Encounter  Procedures  . Culture, OB Urine  . Obstetric panel  . HIV antibody  . Hemoglobinopathy evaluation  . Varicella zoster antibody, IgG  . VITAMIN D 25 Hydroxy (Vit-D Deficiency, Fractures)  . TSH  . POCT urinalysis dipstick    Follow up in 4 weeks. 50% of 30 min visit spent on counseling and coordination of care.

## 2015-08-31 NOTE — Addendum Note (Signed)
Addended by: Henriette CombsHATTON, Soua Lenk L on: 08/31/2015 04:57 PM   Modules accepted: Orders

## 2015-09-01 LAB — VARICELLA ZOSTER ANTIBODY, IGG: VARICELLA IGG: 187.7 {index} — AB (ref <135.00)

## 2015-09-01 LAB — OBSTETRIC PANEL
ANTIBODY SCREEN: NEGATIVE
BASOS ABS: 0 10*3/uL (ref 0.0–0.1)
Basophils Relative: 0 % (ref 0–1)
EOS ABS: 0.1 10*3/uL (ref 0.0–0.7)
EOS PCT: 1 % (ref 0–5)
HCT: 41 % (ref 36.0–46.0)
HEMOGLOBIN: 13.3 g/dL (ref 12.0–15.0)
Hepatitis B Surface Ag: NEGATIVE
LYMPHS PCT: 20 % (ref 12–46)
Lymphs Abs: 2 10*3/uL (ref 0.7–4.0)
MCH: 25.7 pg — AB (ref 26.0–34.0)
MCHC: 32.4 g/dL (ref 30.0–36.0)
MCV: 79.2 fL (ref 78.0–100.0)
MPV: 8.5 fL — AB (ref 8.6–12.4)
Monocytes Absolute: 0.5 10*3/uL (ref 0.1–1.0)
Monocytes Relative: 5 % (ref 3–12)
NEUTROS PCT: 74 % (ref 43–77)
Neutro Abs: 7.3 10*3/uL (ref 1.7–7.7)
Platelets: 383 10*3/uL (ref 150–400)
RBC: 5.18 MIL/uL — AB (ref 3.87–5.11)
RDW: 13.6 % (ref 11.5–15.5)
Rh Type: POSITIVE
Rubella: 0.62 Index (ref <0.90)
WBC: 9.8 10*3/uL (ref 4.0–10.5)

## 2015-09-01 LAB — PAP IG W/ RFLX HPV ASCU

## 2015-09-01 LAB — CULTURE, OB URINE
COLONY COUNT: NO GROWTH
ORGANISM ID, BACTERIA: NO GROWTH

## 2015-09-01 LAB — VITAMIN D 25 HYDROXY (VIT D DEFICIENCY, FRACTURES): Vit D, 25-Hydroxy: 16 ng/mL — ABNORMAL LOW (ref 30–100)

## 2015-09-01 LAB — HIV ANTIBODY (ROUTINE TESTING W REFLEX): HIV 1&2 Ab, 4th Generation: NONREACTIVE

## 2015-09-02 LAB — HEMOGLOBINOPATHY EVALUATION
HEMOGLOBIN OTHER: 0 %
HGB F QUANT: 0 % (ref 0.0–2.0)
HGB S QUANTITAION: 0 %
Hgb A2 Quant: 2.5 % (ref 2.2–3.2)
Hgb A: 97.5 % (ref 96.8–97.8)

## 2015-09-03 LAB — HUMAN PAPILLOMAVIRUS, HIGH RISK: HPV DNA High Risk: DETECTED — AB

## 2015-09-05 LAB — SURESWAB, VAGINOSIS/VAGINITIS PLUS
Atopobium vaginae: 5.7 Log (cells/mL)
C. GLABRATA, DNA: NOT DETECTED
C. PARAPSILOSIS, DNA: NOT DETECTED
C. TROPICALIS, DNA: NOT DETECTED
C. albicans, DNA: NOT DETECTED
C. trachomatis RNA, TMA: NOT DETECTED
Gardnerella vaginalis: 7.8 Log (cells/mL)
LACTOBACILLUS SPECIES: NOT DETECTED Log (cells/mL)
MEGASPHAERA SPECIES: 7.5 Log (cells/mL)
N. gonorrhoeae RNA, TMA: NOT DETECTED
T. VAGINALIS RNA, QL TMA: NOT DETECTED

## 2015-09-08 ENCOUNTER — Other Ambulatory Visit: Payer: Self-pay | Admitting: Certified Nurse Midwife

## 2015-09-08 DIAGNOSIS — B9689 Other specified bacterial agents as the cause of diseases classified elsewhere: Secondary | ICD-10-CM

## 2015-09-08 DIAGNOSIS — N760 Acute vaginitis: Principal | ICD-10-CM

## 2015-09-08 MED ORDER — METRONIDAZOLE 0.75 % VA GEL: 1.0000 | Freq: Two times a day (BID) | VAGINAL | Status: DC

## 2015-09-10 LAB — HPV TYPE 16/18
HPV Genotype, 16: DETECTED — AB
HPV Genotype, 18: NOT DETECTED

## 2015-09-15 ENCOUNTER — Other Ambulatory Visit: Payer: Self-pay | Admitting: Certified Nurse Midwife

## 2015-09-28 ENCOUNTER — Ambulatory Visit (INDEPENDENT_AMBULATORY_CARE_PROVIDER_SITE_OTHER): Payer: Medicaid Other | Admitting: Certified Nurse Midwife

## 2015-09-28 VITALS — BP 134/83 | HR 107 | Temp 98.6°F | Wt 202.0 lb

## 2015-09-28 DIAGNOSIS — Z3482 Encounter for supervision of other normal pregnancy, second trimester: Secondary | ICD-10-CM

## 2015-09-28 LAB — POCT URINALYSIS DIPSTICK
BILIRUBIN UA: NEGATIVE
GLUCOSE UA: NEGATIVE
Leukocytes, UA: NEGATIVE
Nitrite, UA: NEGATIVE
RBC UA: NEGATIVE
SPEC GRAV UA: 1.015
UROBILINOGEN UA: NEGATIVE
pH, UA: 7

## 2015-09-28 NOTE — Progress Notes (Signed)
  Subjective:    Savannah Marsh is a 25 y.o. female being seen today for her obstetrical visit. She is at [redacted]w[redacted]d gestation. Patient reports: no complaints.  Problem List Items Addressed This Visit    None    Visit Diagnoses    Supervision of other normal pregnancy, antepartum, second trimester    -  Primary    Relevant Orders    AFP, Quad Screen    [place] MFM OB COMP + 14 WK    POCT urinalysis dipstick (Completed)      Patient Active Problem List   Diagnosis Date Noted  . Supervision of normal first pregnancy in first trimester 08/31/2015    Objective:     BP 134/83 mmHg  Pulse 107  Temp(Src) 98.6 F (37 C)  Wt 202 lb (91.627 kg)  LMP 06/11/2015 Uterine Size: Below umbilicus   FHR: 150's  Assessment:    Pregnancy @ [redacted]w[redacted]d  weeks Doing well    Plan:    Problem list reviewed and updated. Labs reviewed.  Follow up in 4 weeks. FIRST/CF mutation testing/NIPT/QUAD SCREEN/fragile X/Ashkenazi Jewish population testing/Spinal muscular atrophy discussed: ordered. Role of ultrasound in pregnancy discussed; fetal survey: ordered. Amniocentesis discussed: not indicated. 50% of 15 minute visit spent on counseling and coordination of care.

## 2015-09-29 LAB — AFP, QUAD SCREEN
AFP: 40.7 ng/mL
Age Alone: 1:1100 {titer}
CURR GEST AGE: 15.2 wks.days
Down Syndrome Scr Risk Est: <1:38500 {titer}
HCG, Total: 38.13 IU/mL
INH: 122.7 pg/mL
Interpretation-AFP: NEGATIVE
MOM FOR HCG: 0.93
MOM FOR INH: 0.78
MoM for AFP: 1.7
Open Spina bifida: NEGATIVE
Tri 18 Scr Risk Est: NEGATIVE
Trisomy 18 (Edward) Syndrome Interp.: 1:114000 {titer}
UE3 MOM: 1.01
uE3 Value: 0.65 ng/mL

## 2015-10-26 ENCOUNTER — Ambulatory Visit (INDEPENDENT_AMBULATORY_CARE_PROVIDER_SITE_OTHER): Payer: Medicaid Other | Admitting: Certified Nurse Midwife

## 2015-10-26 ENCOUNTER — Ambulatory Visit (HOSPITAL_COMMUNITY)
Admission: RE | Admit: 2015-10-26 | Discharge: 2015-10-26 | Disposition: A | Discharge status: Home / Self Care | Payer: Medicaid Other | Source: Ambulatory Visit | Attending: Certified Nurse Midwife | Admitting: Certified Nurse Midwife

## 2015-10-26 ENCOUNTER — Other Ambulatory Visit: Payer: Self-pay | Admitting: Certified Nurse Midwife

## 2015-10-26 VITALS — BP 121/75 | HR 91 | Temp 98.3°F | Wt 206.0 lb

## 2015-10-26 DIAGNOSIS — Z1389 Encounter for screening for other disorder: Secondary | ICD-10-CM

## 2015-10-26 DIAGNOSIS — O99212 Obesity complicating pregnancy, second trimester: Secondary | ICD-10-CM | POA: Insufficient documentation

## 2015-10-26 DIAGNOSIS — Z3A19 19 weeks gestation of pregnancy: Secondary | ICD-10-CM | POA: Diagnosis not present

## 2015-10-26 DIAGNOSIS — Z36 Encounter for antenatal screening of mother: Secondary | ICD-10-CM | POA: Diagnosis present

## 2015-10-26 DIAGNOSIS — Z331 Pregnant state, incidental: Secondary | ICD-10-CM

## 2015-10-26 DIAGNOSIS — Z3482 Encounter for supervision of other normal pregnancy, second trimester: Secondary | ICD-10-CM

## 2015-10-26 DIAGNOSIS — Z3402 Encounter for supervision of normal first pregnancy, second trimester: Secondary | ICD-10-CM

## 2015-10-27 ENCOUNTER — Other Ambulatory Visit: Payer: Self-pay | Admitting: Certified Nurse Midwife

## 2015-10-27 NOTE — Progress Notes (Signed)
Subjective:    Savannah Marsh is a 25 y.o. female being seen today for her obstetrical visit. She is at 5923w5d gestation. Patient reports: no complaints . Fetal movement: normal.  Problem List Items Addressed This Visit    None    Visit Diagnoses    Encounter for supervision of normal first pregnancy in second trimester    -  Primary    Relevant Orders    POCT urinalysis dipstick      Patient Active Problem List   Diagnosis Date Noted  . Supervision of normal first pregnancy in first trimester 08/31/2015   Objective:    BP 121/75 mmHg  Pulse 91  Temp(Src) 98.3 F (36.8 C)  Wt 206 lb (93.441 kg)  LMP 06/11/2015 FHT: 145 BPM  Uterine Size: at U     Assessment:    Pregnancy @ 3723w5d    Plan:    OBGCT: discussed. Signs and symptoms of preterm labor: discussed.  Labs, problem list reviewed and updated 2 hr GTT planned Follow up in 4 weeks.

## 2015-11-17 ENCOUNTER — Inpatient Hospital Stay (HOSPITAL_COMMUNITY)
Admission: AD | Admit: 2015-11-17 | Discharge: 2015-11-17 | Disposition: A | Discharge status: Home / Self Care | Payer: Medicaid Other | Source: Ambulatory Visit | Attending: Obstetrics | Admitting: Obstetrics

## 2015-11-17 ENCOUNTER — Encounter (HOSPITAL_COMMUNITY): Payer: Self-pay | Admitting: *Deleted

## 2015-11-17 DIAGNOSIS — Z79899 Other long term (current) drug therapy: Secondary | ICD-10-CM | POA: Insufficient documentation

## 2015-11-17 DIAGNOSIS — Z87891 Personal history of nicotine dependence: Secondary | ICD-10-CM | POA: Insufficient documentation

## 2015-11-17 DIAGNOSIS — Z3A22 22 weeks gestation of pregnancy: Secondary | ICD-10-CM

## 2015-11-17 DIAGNOSIS — Z823 Family history of stroke: Secondary | ICD-10-CM | POA: Diagnosis not present

## 2015-11-17 DIAGNOSIS — O26892 Other specified pregnancy related conditions, second trimester: Secondary | ICD-10-CM | POA: Diagnosis not present

## 2015-11-17 DIAGNOSIS — R112 Nausea with vomiting, unspecified: Secondary | ICD-10-CM | POA: Diagnosis present

## 2015-11-17 DIAGNOSIS — K529 Noninfective gastroenteritis and colitis, unspecified: Secondary | ICD-10-CM

## 2015-11-17 DIAGNOSIS — Z833 Family history of diabetes mellitus: Secondary | ICD-10-CM | POA: Diagnosis not present

## 2015-11-17 DIAGNOSIS — O99612 Diseases of the digestive system complicating pregnancy, second trimester: Secondary | ICD-10-CM | POA: Diagnosis not present

## 2015-11-17 LAB — CBC WITH DIFFERENTIAL/PLATELET
BASOS ABS: 0 10*3/uL (ref 0.0–0.1)
BASOS PCT: 0 %
EOS ABS: 0.1 10*3/uL (ref 0.0–0.7)
Eosinophils Relative: 1 %
HCT: 36 % (ref 36.0–46.0)
HEMOGLOBIN: 11.8 g/dL — AB (ref 12.0–15.0)
Lymphocytes Relative: 18 %
Lymphs Abs: 1.8 10*3/uL (ref 0.7–4.0)
MCH: 25.6 pg — ABNORMAL LOW (ref 26.0–34.0)
MCHC: 32.8 g/dL (ref 30.0–36.0)
MCV: 78.1 fL (ref 78.0–100.0)
Monocytes Absolute: 0.5 10*3/uL (ref 0.1–1.0)
Monocytes Relative: 5 %
NEUTROS PCT: 76 %
Neutro Abs: 7.7 10*3/uL (ref 1.7–7.7)
Platelets: 313 10*3/uL (ref 150–400)
RBC: 4.61 MIL/uL (ref 3.87–5.11)
RDW: 13.8 % (ref 11.5–15.5)
WBC: 10 10*3/uL (ref 4.0–10.5)

## 2015-11-17 LAB — URINALYSIS, ROUTINE W REFLEX MICROSCOPIC
Bilirubin Urine: NEGATIVE
GLUCOSE, UA: NEGATIVE mg/dL
Hgb urine dipstick: NEGATIVE
KETONES UR: 40 mg/dL — AB
LEUKOCYTES UA: NEGATIVE
NITRITE: NEGATIVE
PROTEIN: NEGATIVE mg/dL
Specific Gravity, Urine: 1.02 (ref 1.005–1.030)
pH: 7 (ref 5.0–8.0)

## 2015-11-17 MED ORDER — SODIUM CHLORIDE 0.9 % IV SOLN
8.0000 mg | Freq: Once | INTRAVENOUS | Status: AC
  Administered 2015-11-17: 8 mg via INTRAVENOUS
  Filled 2015-11-17: qty 4

## 2015-11-17 MED ORDER — PROMETHAZINE HCL 25 MG PO TABS: 25.0000 mg | ORAL_TABLET | Freq: Four times a day (QID) | ORAL | Status: DC | PRN

## 2015-11-17 MED ORDER — LACTATED RINGERS IV BOLUS (SEPSIS)
1000.0000 mL | Freq: Once | INTRAVENOUS | Status: AC
  Administered 2015-11-17: 1000 mL via INTRAVENOUS

## 2015-11-17 MED ORDER — FAMOTIDINE IN NACL 20-0.9 MG/50ML-% IV SOLN
20.0000 mg | Freq: Once | INTRAVENOUS | Status: AC
  Administered 2015-11-17: 20 mg via INTRAVENOUS
  Filled 2015-11-17: qty 50

## 2015-11-17 MED ORDER — ONDANSETRON 8 MG PO TBDP: 8.0000 mg | ORAL_TABLET | Freq: Three times a day (TID) | ORAL | Status: DC | PRN

## 2015-11-17 NOTE — MAU Provider Note (Signed)
History     CSN: [id number]  Arrival date and time: 11/17/15 [id number]   First Provider Initiated Contact with Patient 11/17/15 1141       Chief Complaint  Patient presents with  . Nausea  . Dizziness   HPI  Savannah Marsh is a 25 y.o. G1P0 at 7157w5d who presents for n/v/d and episodes of dizziness. Reports symptoms began yesterday. Has vomited once today and continues to be nauseated. Had 2 watery stools today. Frontal headache all day that is worse with standing. Rates headache 6/10. Has not treated. Occasional dizziness when up and moving around. Denies abdominal pain, vaginal bleeding, fever, sick contacts, or syncope. Positive fetal movement.    OB History    Gravida Para Term Preterm AB TAB SAB Ectopic Multiple Living   1               Past Medical History  Diagnosis Date  . Medical history non-contributory     Past Surgical History  Procedure Laterality Date  . Knee surgery      Family History  Problem Relation Age of Onset  . Stroke Father   . Diabetes Father     Social History  Substance Use Topics  . Smoking status: Former Smoker    Quit date: 07/16/2015  . Smokeless tobacco: Never Used  . Alcohol Use: No    Allergies: No Known Allergies  Prescriptions prior to admission  Medication Sig Dispense Refill Last Dose  . Doxylamine-Pyridoxine (DICLEGIS) 10-10 MG TBEC Take 1 tablet with breakfast and lunch.  Take 2 tablets at bedtime. (Patient not taking: Reported on 09/28/2015) 100 tablet 4 Not Taking  . metoCLOPramide (REGLAN) 10 MG tablet Take 1 tablet (10 mg total) by mouth 4 (four) times daily -  before meals and at bedtime. (Patient not taking: Reported on 09/28/2015) 120 tablet 3 Not Taking  . Prenatal Vit-Fe Phos-FA-Omega (VITAFOL GUMMIES) 3.33-0.333-34.8 MG CHEW Chew 3 tablets by mouth daily. 90 tablet 12 Taking  . promethazine (PHENERGAN) 25 MG tablet Take 1 tablet (25 mg total) by mouth every 6 (six) hours as needed for nausea or vomiting. (Patient not taking:  Reported on 09/28/2015) 30 tablet 0 Not Taking    Review of Systems  Constitutional: Negative for fever and chills.  HENT: Negative for sore throat.   Eyes: Negative.   Respiratory: Negative.   Cardiovascular: Negative.   Gastrointestinal: Positive for heartburn, nausea, vomiting and diarrhea. Negative for abdominal pain, constipation and blood in stool.  Genitourinary: Negative.   Neurological: Positive for dizziness and headaches. Negative for loss of consciousness.   Physical Exam   Blood pressure 119/52, pulse 67, temperature 98.2 F (36.8 C), temperature source Oral, resp. rate 18, weight 205 lb 6.4 oz (93.169 kg), last menstrual period 06/11/2015.  Physical Exam  Nursing note and vitals reviewed. Constitutional: She is oriented to person, place, and time. She appears well-developed and well-nourished. No distress.  HENT:  Head: Normocephalic and atraumatic.  Eyes: Conjunctivae are normal. Right eye exhibits no discharge. Left eye exhibits no discharge. No scleral icterus.  Neck: Normal range of motion.  Cardiovascular: Normal rate, regular rhythm and normal heart sounds.   No murmur heard. Respiratory: Effort normal and breath sounds normal. No respiratory distress. She has no wheezes.  GI: Soft. Bowel sounds are normal. There is no tenderness. There is no rebound.  Musculoskeletal: Normal range of motion.  Neurological: She is alert and oriented to person, place, and time.  Skin: Skin is warm and dry.  She is not diaphoretic.  Psychiatric: She has a normal mood and affect. Her behavior is normal. Judgment and thought content normal.    MAU Course  Procedures Results for orders placed or performed during the hospital encounter of 11/17/15 (from the past 24 hour(s))  Urinalysis, Routine w reflex microscopic (not at [facility])     Status: Abnormal   Collection Time: 11/17/15 10:50 AM  Result Value Ref Range   Color, Urine YELLOW YELLOW   APPearance CLEAR CLEAR   Specific  Gravity, Urine 1.020 1.005 - 1.030   pH 7.0 5.0 - 8.0   Glucose, UA NEGATIVE NEGATIVE mg/dL   Hgb urine dipstick NEGATIVE NEGATIVE   Bilirubin Urine NEGATIVE NEGATIVE   Ketones, ur 40 (A) NEGATIVE mg/dL   Protein, ur NEGATIVE NEGATIVE mg/dL   Nitrite NEGATIVE NEGATIVE   Leukocytes, UA NEGATIVE NEGATIVE  CBC with Differential     Status: Abnormal   Collection Time: 11/17/15 12:00 PM  Result Value Ref Range   WBC 10.0 4.0 - 10.5 K/uL   RBC 4.61 3.87 - 5.11 MIL/uL   Hemoglobin 11.8 (L) 12.0 - 15.0 g/dL   HCT 86.5 78.4 - 69.6 %   MCV 78.1 78.0 - 100.0 fL   MCH 25.6 (L) 26.0 - 34.0 pg   MCHC 32.8 30.0 - 36.0 g/dL   RDW 29.5 28.4 - 13.2 %   Platelets 313 150 - 400 K/uL   Neutrophils Relative % 76 %   Neutro Abs 7.7 1.7 - 7.7 K/uL   Lymphocytes Relative 18 %   Lymphs Abs 1.8 0.7 - 4.0 K/uL   Monocytes Relative 5 %   Monocytes Absolute 0.5 0.1 - 1.0 K/uL   Eosinophils Relative 1 %   Eosinophils Absolute 0.1 0.0 - 0.7 K/uL   Basophils Relative 0 %   Basophils Absolute 0.0 0.0 - 0.1 K/uL    MDM FHT 140 by doppler IV LR fluid bolus Zofran 8 mg IV Pepcid 20 mg IV Pt reports improvement in symptoms  Assessment and Plan  A: 1. Acute gastroenteritis     P: Discharge home Rx zofran & phenergan Bland diet for diarrhea, advance diet as tolerated Slow position changes Good hand hygiene Discussed reasons to return  Judeth Horn 11/17/2015, 11:41 AM

## 2015-11-17 NOTE — MAU Note (Signed)
Last night, stomach was really cramping. Felt like she was going to pass out.  Kind of felt like morning sickness. Did throw up.  This morning when she got up, she felt dizzy and nausous. No longer feels crampy

## 2015-11-17 NOTE — Discharge Instructions (Signed)

## 2015-11-23 ENCOUNTER — Ambulatory Visit (INDEPENDENT_AMBULATORY_CARE_PROVIDER_SITE_OTHER): Payer: Medicaid Other | Admitting: Certified Nurse Midwife

## 2015-11-23 VITALS — BP 111/71 | HR 93 | Temp 98.6°F | Wt 207.0 lb

## 2015-11-23 DIAGNOSIS — Z3402 Encounter for supervision of normal first pregnancy, second trimester: Secondary | ICD-10-CM

## 2015-11-23 LAB — POCT URINALYSIS DIPSTICK
Glucose, UA: NEGATIVE
Leukocytes, UA: NEGATIVE
Nitrite, UA: NEGATIVE
PH UA: 6.5
RBC UA: NEGATIVE
Spec Grav, UA: 1.015
Urobilinogen, UA: 0.2

## 2015-11-23 NOTE — Progress Notes (Signed)
Subjective:    Savannah Marsh is a 25 y.o. female being seen today for her obstetrical visit. She is at 3335w4d gestation. Patient reports: no complaints . Fetal movement: normal.  Problem List Items Addressed This Visit    None    Visit Diagnoses    Encounter for supervision of normal first pregnancy in second trimester    -  Primary      Patient Active Problem List   Diagnosis Date Noted  . Supervision of normal first pregnancy in first trimester 08/31/2015   Objective:    BP 111/71 mmHg  Pulse 93  Temp(Src) 98.6 F (37 C)  Wt 207 lb (93.895 kg)  LMP 06/11/2015 FHT: 150 BPM  Uterine Size: size equals dates     Assessment:    Pregnancy @ 1335w4d    Plan:   discussed waterbirth, handout given.    OBGCT: discussed. Signs and symptoms of preterm labor: discussed.  Labs, problem list reviewed and updated 2 hr GTT planned Follow up in 4 weeks.

## 2015-11-23 NOTE — Progress Notes (Signed)
Patient states she has no complaints.

## 2015-12-07 ENCOUNTER — Other Ambulatory Visit: Payer: Self-pay | Admitting: Certified Nurse Midwife

## 2015-12-21 ENCOUNTER — Ambulatory Visit (INDEPENDENT_AMBULATORY_CARE_PROVIDER_SITE_OTHER): Payer: Medicaid Other | Admitting: Certified Nurse Midwife

## 2015-12-21 ENCOUNTER — Other Ambulatory Visit: Payer: Medicaid Other

## 2015-12-21 VITALS — BP 127/82 | HR 77 | Temp 98.4°F | Wt 211.0 lb

## 2015-12-21 DIAGNOSIS — O2242 Hemorrhoids in pregnancy, second trimester: Secondary | ICD-10-CM

## 2015-12-21 DIAGNOSIS — Z3402 Encounter for supervision of normal first pregnancy, second trimester: Secondary | ICD-10-CM

## 2015-12-21 LAB — POCT URINALYSIS DIPSTICK
Bilirubin, UA: NEGATIVE
GLUCOSE UA: NEGATIVE
NITRITE UA: NEGATIVE
PH UA: 7
PROTEIN UA: NEGATIVE
RBC UA: NEGATIVE
Spec Grav, UA: 1.015
UROBILINOGEN UA: NEGATIVE

## 2015-12-21 MED ORDER — HYDROCORTISONE ACETATE 25 MG RE SUPP: 25.0000 mg | Freq: Two times a day (BID) | RECTAL | Status: DC

## 2015-12-21 NOTE — Progress Notes (Signed)
Subjective:    Savannah Marsh is a 25 y.o. female being seen today for her obstetrical visit. She is at 3738w4d gestation. Patient reports: no complaints . Fetal movement: normal.  Problem List Items Addressed This Visit    None    Visit Diagnoses    Encounter for supervision of normal first pregnancy in second trimester    -  Primary    Relevant Orders    US MFM OB FOLLOW UP      Patient Active Problem List   Diagnosis Date Noted  . Supervision of normal first pregnancy in first trimester 08/31/2015   Objective:    BP 127/82 mmHg  Pulse 77  Temp(Src) 98.4 F (36.9 C)  Wt 211 lb (95.709 kg)  LMP 06/11/2015 FHT: 135 BPM  Uterine Size: size equals dates     Assessment:    Pregnancy @ 8638w4d    Doing well.   Plan:    OBGCT: ordered. Signs and symptoms of preterm labor: discussed.  Labs, problem list reviewed and updated 2 hr GTT today Follow up in 2 weeks.

## 2015-12-21 NOTE — Progress Notes (Signed)
Patient has concerns that she is starting to getting hemorrhoids. .Marland Kitchen

## 2015-12-22 ENCOUNTER — Other Ambulatory Visit: Payer: Self-pay | Admitting: Certified Nurse Midwife

## 2015-12-22 DIAGNOSIS — O24410 Gestational diabetes mellitus in pregnancy, diet controlled: Secondary | ICD-10-CM

## 2015-12-22 LAB — GLUCOSE TOLERANCE, 2 HOURS W/ 1HR
GLUCOSE, 1 HOUR: 183 mg/dL — AB (ref 65–179)
GLUCOSE, 2 HOUR: 110 mg/dL (ref 65–152)
Glucose, Fasting: 95 mg/dL — ABNORMAL HIGH (ref 65–91)

## 2015-12-22 LAB — CBC
HEMATOCRIT: 36.9 % (ref 34.0–46.6)
HEMOGLOBIN: 11.8 g/dL (ref 11.1–15.9)
MCH: 25 pg — ABNORMAL LOW (ref 26.6–33.0)
MCHC: 32 g/dL (ref 31.5–35.7)
MCV: 78 fL — ABNORMAL LOW (ref 79–97)
Platelets: 299 10*3/uL (ref 150–379)
RBC: 4.72 x10E6/uL (ref 3.77–5.28)
RDW: 14 % (ref 12.3–15.4)
WBC: 11.2 10*3/uL — AB (ref 3.4–10.8)

## 2015-12-22 LAB — RPR: RPR: NONREACTIVE

## 2015-12-22 LAB — HIV ANTIBODY (ROUTINE TESTING W REFLEX): HIV SCREEN 4TH GENERATION: NONREACTIVE

## 2015-12-23 ENCOUNTER — Encounter: Payer: Self-pay | Admitting: *Deleted

## 2015-12-27 ENCOUNTER — Other Ambulatory Visit: Payer: Self-pay | Admitting: Certified Nurse Midwife

## 2015-12-27 ENCOUNTER — Ambulatory Visit (HOSPITAL_COMMUNITY)
Admission: RE | Admit: 2015-12-27 | Discharge: 2015-12-27 | Disposition: A | Discharge status: Home / Self Care | Payer: Medicaid Other | Source: Ambulatory Visit | Attending: Certified Nurse Midwife | Admitting: Certified Nurse Midwife

## 2015-12-27 DIAGNOSIS — Z3A28 28 weeks gestation of pregnancy: Secondary | ICD-10-CM

## 2015-12-27 DIAGNOSIS — O99213 Obesity complicating pregnancy, third trimester: Secondary | ICD-10-CM

## 2015-12-27 DIAGNOSIS — Z36 Encounter for antenatal screening of mother: Secondary | ICD-10-CM | POA: Insufficient documentation

## 2015-12-27 DIAGNOSIS — IMO0002 Reserved for concepts with insufficient information to code with codable children: Secondary | ICD-10-CM

## 2015-12-27 DIAGNOSIS — O24410 Gestational diabetes mellitus in pregnancy, diet controlled: Secondary | ICD-10-CM | POA: Diagnosis not present

## 2015-12-27 DIAGNOSIS — Z0489 Encounter for examination and observation for other specified reasons: Secondary | ICD-10-CM

## 2015-12-28 ENCOUNTER — Other Ambulatory Visit: Payer: Self-pay | Admitting: Certified Nurse Midwife

## 2016-01-04 ENCOUNTER — Encounter: Payer: Medicaid Other | Admitting: Certified Nurse Midwife

## 2016-01-06 ENCOUNTER — Ambulatory Visit (INDEPENDENT_AMBULATORY_CARE_PROVIDER_SITE_OTHER): Payer: Medicaid Other | Admitting: Certified Nurse Midwife

## 2016-01-06 ENCOUNTER — Telehealth: Payer: Self-pay | Admitting: *Deleted

## 2016-01-06 VITALS — BP 127/90 | HR 98 | Wt 208.0 lb

## 2016-01-06 DIAGNOSIS — O24919 Unspecified diabetes mellitus in pregnancy, unspecified trimester: Secondary | ICD-10-CM

## 2016-01-06 DIAGNOSIS — O24419 Gestational diabetes mellitus in pregnancy, unspecified control: Secondary | ICD-10-CM

## 2016-01-06 DIAGNOSIS — Z3403 Encounter for supervision of normal first pregnancy, third trimester: Secondary | ICD-10-CM

## 2016-01-06 LAB — POCT URINALYSIS DIPSTICK
BILIRUBIN UA: NEGATIVE
Blood, UA: NEGATIVE
Glucose, UA: NEGATIVE
LEUKOCYTES UA: NEGATIVE
Nitrite, UA: NEGATIVE
PH UA: 6.5
Spec Grav, UA: 1.01
Urobilinogen, UA: NEGATIVE

## 2016-01-06 NOTE — Progress Notes (Signed)
Subjective:    Savannah Marsh is a 25 y.o. female being seen today for her obstetrical visit. She is at 512w6d gestation. Patient reports no complaints. Fetal movement: normal.  Was not able to schedule DM teaching before today, VM was not set up.  Discussed importance of keeping up with appointments.  GDM teaching given/orders sent to pharmacy.  Patient verbalized understanding.  Problem List Items Addressed This Visit    None    Visit Diagnoses    Gestational diabetes mellitus (GDM) affecting pregnancy, antepartum (HCC)    -  Primary    Relevant Orders    AMB referral to maternal fetal medicine    Amb Referral to Nutrition and Diabetic E    US MFM OB FOLLOW UP      Patient Active Problem List   Diagnosis Date Noted  . Supervision of normal first pregnancy in first trimester 08/31/2015   Objective:    BP 127/90 mmHg  Pulse 98  Wt 208 lb (94.348 kg)  LMP 06/11/2015 FHT:  145 BPM  Uterine Size: size equals dates  Presentation: unsure     Assessment:    Pregnancy @ 4712w6d weeks   Plan:     labs reviewed, problem list updated Consent signed. GBS sent TDAP offered  Rhogam given for RH negative Pediatrician: discussed. Infant feeding: plans to breastfeed. Maternity leave: discussed. Cigarette smoking: never smoked. Orders Placed This Encounter  Procedures  . US MFM OB FOLLOW UP    Standing Status: Future     Number of Occurrences:      Standing Expiration Date: 03/07/2017    Order Specific Question:  Reason for Exam (SYMPTOM  OR DIAGNOSIS REQUIRED)    Answer:  GDM    Order Specific Question:  Preferred imaging location?    Answer:  [facility]  . AMB referral to maternal fetal medicine    Referral Priority:  Routine    Referral Type:  Consultation    Referral Reason:  Specialty Services Required    Number of Visits Requested:  1  . Amb Referral to Nutrition and Diabetic E    Referral Priority:  Routine    Referral Type:  Consultation    Referral Reason:   Specialty Services Required    Number of Visits Requested:  1   No orders of the defined types were placed in this encounter.   Follow up in 2 Weeks.

## 2016-01-06 NOTE — Telephone Encounter (Signed)
Diabetic testing supplies called into pharmacy. [facility].

## 2016-01-06 NOTE — Addendum Note (Signed)
Addended by: Marya LandryFOSTER, Sandrine Bloodsworth D on: 01/06/2016 01:39 PM   Modules accepted: Orders

## 2016-01-20 ENCOUNTER — Encounter: Payer: Self-pay | Admitting: Certified Nurse Midwife

## 2016-01-21 ENCOUNTER — Encounter (HOSPITAL_COMMUNITY): Payer: Self-pay

## 2016-01-21 ENCOUNTER — Inpatient Hospital Stay (HOSPITAL_COMMUNITY)
Admission: AD | Admit: 2016-01-21 | Discharge: 2016-01-21 | Disposition: A | Discharge status: Home / Self Care | Payer: Medicaid Other | Source: Ambulatory Visit | Attending: Obstetrics | Admitting: Obstetrics

## 2016-01-21 DIAGNOSIS — A0819 Acute gastroenteropathy due to other small round viruses: Secondary | ICD-10-CM | POA: Diagnosis not present

## 2016-01-21 DIAGNOSIS — R197 Diarrhea, unspecified: Secondary | ICD-10-CM | POA: Diagnosis present

## 2016-01-21 DIAGNOSIS — O9989 Other specified diseases and conditions complicating pregnancy, childbirth and the puerperium: Secondary | ICD-10-CM

## 2016-01-21 DIAGNOSIS — O99613 Diseases of the digestive system complicating pregnancy, third trimester: Secondary | ICD-10-CM | POA: Insufficient documentation

## 2016-01-21 DIAGNOSIS — Z3A32 32 weeks gestation of pregnancy: Secondary | ICD-10-CM | POA: Diagnosis not present

## 2016-01-21 DIAGNOSIS — A0811 Acute gastroenteropathy due to Norwalk agent: Secondary | ICD-10-CM

## 2016-01-21 DIAGNOSIS — Z87891 Personal history of nicotine dependence: Secondary | ICD-10-CM | POA: Diagnosis not present

## 2016-01-21 DIAGNOSIS — O133 Gestational [pregnancy-induced] hypertension without significant proteinuria, third trimester: Secondary | ICD-10-CM | POA: Diagnosis not present

## 2016-01-21 LAB — CBC WITH DIFFERENTIAL/PLATELET
BASOS ABS: 0 10*3/uL (ref 0.0–0.1)
Basophils Relative: 0 %
EOS PCT: 0 %
Eosinophils Absolute: 0 10*3/uL (ref 0.0–0.7)
HCT: 37.7 % (ref 36.0–46.0)
Hemoglobin: 12.5 g/dL (ref 12.0–15.0)
LYMPHS PCT: 17 %
Lymphs Abs: 1.3 10*3/uL (ref 0.7–4.0)
MCH: 25.2 pg — AB (ref 26.0–34.0)
MCHC: 33.2 g/dL (ref 30.0–36.0)
MCV: 75.9 fL — AB (ref 78.0–100.0)
MONO ABS: 0.4 10*3/uL (ref 0.1–1.0)
Monocytes Relative: 5 %
Neutro Abs: 5.7 10*3/uL (ref 1.7–7.7)
Neutrophils Relative %: 78 %
PLATELETS: 273 10*3/uL (ref 150–400)
RBC: 4.97 MIL/uL (ref 3.87–5.11)
RDW: 14 % (ref 11.5–15.5)
WBC: 7.4 10*3/uL (ref 4.0–10.5)

## 2016-01-21 LAB — COMPREHENSIVE METABOLIC PANEL
ALBUMIN: 3.1 g/dL — AB (ref 3.5–5.0)
ALT: 21 U/L (ref 14–54)
AST: 42 U/L — AB (ref 15–41)
Alkaline Phosphatase: 136 U/L — ABNORMAL HIGH (ref 38–126)
Anion gap: 10 (ref 5–15)
BILIRUBIN TOTAL: 0.5 mg/dL (ref 0.3–1.2)
BUN: 6 mg/dL (ref 6–20)
CO2: 22 mmol/L (ref 22–32)
Calcium: 8.6 mg/dL — ABNORMAL LOW (ref 8.9–10.3)
Chloride: 102 mmol/L (ref 101–111)
Creatinine, Ser: 0.54 mg/dL (ref 0.44–1.00)
GFR calc Af Amer: >60 mL/min (ref >60)
GFR calc non Af Amer: >60 mL/min (ref >60)
GLUCOSE: 92 mg/dL (ref 65–99)
POTASSIUM: 3.4 mmol/L — AB (ref 3.5–5.1)
Sodium: 134 mmol/L — ABNORMAL LOW (ref 135–145)
TOTAL PROTEIN: 7 g/dL (ref 6.5–8.1)

## 2016-01-21 LAB — URINALYSIS, ROUTINE W REFLEX MICROSCOPIC
Bilirubin Urine: NEGATIVE
Glucose, UA: NEGATIVE mg/dL
Hgb urine dipstick: NEGATIVE
Ketones, ur: 40 mg/dL — AB
Nitrite: NEGATIVE
PH: 6 (ref 5.0–8.0)
PROTEIN: 30 mg/dL — AB
Specific Gravity, Urine: 1.02 (ref 1.005–1.030)

## 2016-01-21 LAB — URINE MICROSCOPIC-ADD ON
BACTERIA UA: NONE SEEN
RBC / HPF: NONE SEEN RBC/hpf (ref 0–5)

## 2016-01-21 LAB — PROTEIN / CREATININE RATIO, URINE
Creatinine, Urine: 192 mg/dL
PROTEIN CREATININE RATIO: 0.2 mg/mg{creat} — AB (ref 0.00–0.15)
Total Protein, Urine: 39 mg/dL

## 2016-01-21 LAB — LIPASE, BLOOD: Lipase: 16 U/L (ref 11–51)

## 2016-01-21 MED ORDER — SODIUM CHLORIDE 0.9 % IV BOLUS (SEPSIS)
1000.0000 mL | Freq: Once | INTRAVENOUS | Status: AC
  Administered 2016-01-21: 1000 mL via INTRAVENOUS

## 2016-01-21 MED ORDER — METOCLOPRAMIDE HCL 5 MG/ML IJ SOLN
10.0000 mg | Freq: Once | INTRAMUSCULAR | Status: AC
  Administered 2016-01-21: 10 mg via INTRAVENOUS
  Filled 2016-01-21: qty 2

## 2016-01-21 MED ORDER — ONDANSETRON 8 MG PO TBDP: 8.0000 mg | ORAL_TABLET | Freq: Three times a day (TID) | ORAL | Status: DC | PRN

## 2016-01-21 MED ORDER — PROMETHAZINE HCL 25 MG/ML IJ SOLN
25.0000 mg | Freq: Once | INTRAMUSCULAR | Status: AC
  Administered 2016-01-21: 25 mg via INTRAVENOUS
  Filled 2016-01-21: qty 1

## 2016-01-21 MED ORDER — SODIUM CHLORIDE 0.9 % IV SOLN
8.0000 mg | Freq: Once | INTRAVENOUS | Status: AC
  Administered 2016-01-21: 8 mg via INTRAVENOUS
  Filled 2016-01-21: qty 4

## 2016-01-21 NOTE — MAU Provider Note (Signed)
MAU HISTORY AND PHYSICAL  Chief Complaint:  Diarrhea and vomiting  Savannah Marsh is a 25 y.o.  G1P0 with IUP at [redacted]w[redacted]d presenting for diarrhea and vomiting.  2 days of watery diarrhea, no blood or significant mucous. Nausea and vomiting began last night, once last night and 5 times today. Feeling somewhat dizzy after vomiting. No abdominal pain. No bleeding or lof. Positive fm. Denies sick contacts. No fevers. No ha, vision change, or ruq/epigastric pain, or sob.   Past Medical History  Diagnosis Date  . Medical history non-contributory     Past Surgical History  Procedure Laterality Date  . Knee surgery      Family History  Problem Relation Age of Onset  . Stroke Father   . Diabetes Father     Social History  Substance Use Topics  . Smoking status: Former Smoker    Quit date: 07/16/2015  . Smokeless tobacco: Never Used  . Alcohol Use: No    No Known Allergies  Prescriptions prior to admission  Medication Sig Dispense Refill Last Dose  . Prenatal Vit-Fe Phos-FA-Omega (VITAFOL GUMMIES) 3.33-0.333-34.8 MG CHEW Chew 3 tablets by mouth daily. 90 tablet 12 01/20/2016 at Unknown time  . hydrocortisone (ANUSOL-HC) 25 MG suppository Place 1 suppository (25 mg total) rectally 2 (two) times daily. (Patient not taking: Reported on 01/21/2016) 12 suppository 0 Not Taking at Unknown time    Review of Systems - Negative except for what is mentioned in HPI.  Physical Exam  Blood pressure 137/85, pulse 101, temperature 98.1 F (36.7 C), resp. rate 17, height  (1.549 m), weight 203 lb 3.2 oz (92.171 kg), last menstrual period 06/11/2015, SpO2 98 %. GENERAL: Well-developed, well-nourished female in no acute distress. Holding bag full of emesis LUNGS: Clear to auscultation bilaterally.  HEART: Regular rate and rhythm. ABDOMEN: Soft, nontender, nondistended, gravid.  EXTREMITIES: Nontender, no edema, 2+ distal pulses.  Labs: Results for orders placed or performed during the hospital  encounter of 01/21/16 (from the past 24 hour(s))  Urinalysis, Routine w reflex microscopic (not at [facility])   Collection Time: 01/21/16  4:45 PM  Result Value Ref Range   Color, Urine YELLOW YELLOW   APPearance CLEAR CLEAR   Specific Gravity, Urine 1.020 1.005 - 1.030   pH 6.0 5.0 - 8.0   Glucose, UA NEGATIVE NEGATIVE mg/dL   Hgb urine dipstick NEGATIVE NEGATIVE   Bilirubin Urine NEGATIVE NEGATIVE   Ketones, ur 40 (A) NEGATIVE mg/dL   Protein, ur 30 (A) NEGATIVE mg/dL   Nitrite NEGATIVE NEGATIVE   Leukocytes, UA TRACE (A) NEGATIVE  Urine microscopic-add on   Collection Time: 01/21/16  4:45 PM  Result Value Ref Range   Squamous Epithelial / LPF 0-5 (A) NONE SEEN   WBC, UA 0-5 0 - 5 WBC/hpf   RBC / HPF NONE SEEN 0 - 5 RBC/hpf   Bacteria, UA NONE SEEN NONE SEEN   Urine-Other MUCOUS PRESENT   Protein / creatinine ratio, urine   Collection Time: 01/21/16  4:45 PM  Result Value Ref Range   Creatinine, Urine 192.00 mg/dL   Total Protein, Urine 39 mg/dL   Protein Creatinine Ratio 0.20 (H) 0.00 - 0.15 mg/mg[Cre]  Lipase, blood   Collection Time: 01/21/16  6:10 PM  Result Value Ref Range   Lipase 16 11 - 51 U/L  Comprehensive metabolic panel   Collection Time: 01/21/16  6:10 PM  Result Value Ref Range   Sodium 134 (L) 135 - 145 mmol/L   Potassium 3.4 (  L) 3.5 - 5.1 mmol/L   Chloride 102 101 - 111 mmol/L   CO2 22 22 - 32 mmol/L   Glucose, Bld 92 65 - 99 mg/dL   BUN 6 6 - 20 mg/dL   Creatinine, Ser 1.610.54 0.44 - 1.00 mg/dL   Calcium 8.6 (L) 8.9 - 10.3 mg/dL   Total Protein 7.0 6.5 - 8.1 g/dL   Albumin 3.1 (L) 3.5 - 5.0 g/dL   AST 42 (H) 15 - 41 U/L   ALT 21 14 - 54 U/L   Alkaline Phosphatase 136 (H) 38 - 126 U/L   Total Bilirubin 0.5 0.3 - 1.2 mg/dL   GFR calc non Af Amer >60 >60 mL/min   GFR calc Af Amer >60 >60 mL/min   Anion gap 10 5 - 15  CBC with Differential/Platelet   Collection Time: 01/21/16  6:10 PM  Result Value Ref Range   WBC 7.4 4.0 - 10.5 K/uL   RBC 4.97 3.87 -  5.11 MIL/uL   Hemoglobin 12.5 12.0 - 15.0 g/dL   HCT 09.637.7 04.536.0 - 40.946.0 %   MCV 75.9 (L) 78.0 - 100.0 fL   MCH 25.2 (L) 26.0 - 34.0 pg   MCHC 33.2 30.0 - 36.0 g/dL   RDW 81.114.0 91.411.5 - 78.215.5 %   Platelets 273 150 - 400 K/uL   Neutrophils Relative % 78 %   Neutro Abs 5.7 1.7 - 7.7 K/uL   Lymphocytes Relative 17 %   Lymphs Abs 1.3 0.7 - 4.0 K/uL   Monocytes Relative 5 %   Monocytes Absolute 0.4 0.1 - 1.0 K/uL   Eosinophils Relative 0 %   Eosinophils Absolute 0.0 0.0 - 0.7 K/uL   Basophils Relative 0 %   Basophils Absolute 0.0 0.0 - 0.1 K/uL    Imaging Studies:  [place] [facility] Ob Follow Up  12/27/2015  OBSTETRICAL ULTRASOUND: This exam was performed within a [facility]. The OB US report was generated in the AS system, and faxed to the ordering physician.  This report is available in the [employer] PACS. See the AS Obstetric US report via the Image Link.   MDM - labs, 1 L NS, zofran, phenergan, po challenge - after above PO challenge, patient vomited shortly after attempt, will give reglan - intermittent mildly elevated BP. No symptoms preeclampsia, labs not suggestive. Discussed w/ Dr. Clearance CootsHarper, will need close f/u, has appt early next week. - Reglan give, patient feel well and ready to go home.   Cherrie Gauzeoah B Wouk 6/2/20177:21 PM  1. Norovirus   2. Gestational hypertension, third trimester     P:  Discharge home in stable condition  RX Zofran Patient to call Dr. Verdell CarmineHarper's office on Monday for BP check Return to MAU if symptoms worsen   Duane LopeJennifer I Dashanae Longfield, NP 01/21/2016 01/21/2016 9:31 PM

## 2016-01-21 NOTE — Discharge Instructions (Signed)
Hypertension During Pregnancy Hypertension is also called high blood pressure. Blood pressure moves blood in your body. Sometimes, the force that moves the blood becomes too strong. When you are pregnant, this condition should be watched carefully. It can cause problems for you and your baby. HOME CARE   Make and keep all of your doctor visits.  Take medicine as told by your doctor. Tell your doctor about all medicines you take.  Eat very little salt.  Exercise regularly.  Do not drink alcohol.  Do not smoke.  Do not have drinks with caffeine.  Lie on your left side when resting.  Your health care provider may ask you to take one low-dose aspirin (81mg ) each day. GET HELP RIGHT AWAY IF:  You have bad belly (abdominal) pain.  You have sudden puffiness (swelling) in the hands, ankles, or face.  You gain 4 pounds (1.8 kilograms) or more in 1 week.  You throw up (vomit) repeatedly.  You have bleeding from the vagina.  You do not feel the baby moving as much.  You have a headache.  You have blurred or double vision.  You have muscle twitching or spasms.  You have shortness of breath.  You have blue fingernails and lips.  You have blood in your pee (urine). MAKE SURE YOU:  Understand these instructions.  Will watch your condition.  Will get help right away if you are not doing well or get worse.   This information is not intended to replace advice given to you by your health care provider. Make sure you discuss any questions you have with your health care provider.   Document Released: 09/09/2010 Document Revised: 08/28/2014 Document Reviewed: 03/06/2013 Elsevier Interactive Patient Education 2016 [employer] Norovirus Infection A norovirus infection is caused by exposure to a virus in a group of similar viruses (noroviruses). This type of infection causes inflammation in your stomach and intestines (gastroenteritis). Norovirus is the most common cause of  gastroenteritis. It also causes food poisoning. Anyone can get a norovirus infection. It spreads very easily (contagious). You can get it from contaminated food, water, surfaces, or other people. Norovirus is found in the stool or vomit of infected people. You can spread the infection as soon as you feel sick until 2 weeks after you recover.  Symptoms usually begin within 2 days after you become infected. Most norovirus symptoms affect the digestive system. CAUSES Norovirus infection is caused by contact with norovirus. You can catch norovirus if you:  Eat or drink something contaminated with norovirus.  Touch surfaces or objects contaminated with norovirus and then put your hand in your mouth.  Have direct contact with an infected person who has symptoms.  Share food, drink, or utensils with someone with who is sick with norovirus. SIGNS AND SYMPTOMS Symptoms of norovirus may include:  Nausea.  Vomiting.  Diarrhea.  Stomach cramps.  Fever.  Chills.  Headache.  Muscle aches.  Tiredness. DIAGNOSIS Your health care provider may suspect norovirus based on your symptoms and physical exam. Your health care provider may also test a sample of your stool or vomit for the virus.  TREATMENT There is no specific treatment for norovirus. Most people get better without treatment in about 2 days. HOME CARE INSTRUCTIONS  Replace lost fluids by drinking plenty of water or rehydration fluids containing important minerals called electrolytes. This prevents dehydration. Drink enough fluid to keep your urine clear or pale yellow.  Do not prepare food for others while you are infected. Wait  at least 3 days after recovering from the illness to do that. PREVENTION   Wash your hands often, especially after using the toilet or changing a diaper.  Wash fruits and vegetables thoroughly before preparing or serving them.  Throw out any food that a sick person may have touched.  Disinfect  contaminated surfaces immediately after someone in the household has been sick. Use a bleach-based household cleaner.  Immediately remove and wash soiled clothes or sheets. SEEK MEDICAL CARE IF:  Your vomiting, diarrhea, and stomach pain is getting worse.  Your symptoms of norovirus do not go away after 2-3 days. SEEK IMMEDIATE MEDICAL CARE IF:  You develop symptoms of dehydration that do not improve with fluid replacement. This may include:  Excessive sleepiness.  Lack of tears.  Dry mouth.  Dizziness when standing.  Weak pulse.   This information is not intended to replace advice given to you by your health care provider. Make sure you discuss any questions you have with your health care provider.   Document Released: 10/28/2002 Document Revised: 08/28/2014 Document Reviewed: 01/15/2014 Elsevier Interactive Patient Education [employer]

## 2016-01-21 NOTE — MAU Note (Signed)
Pt staed she has had diarrhea since last night. Stated she was feeling dizzy and seeing spots and then vomited. . Still Having N/V?d today and feels dizy and light headed.

## 2016-01-25 ENCOUNTER — Other Ambulatory Visit: Payer: Self-pay | Admitting: Certified Nurse Midwife

## 2016-01-25 ENCOUNTER — Ambulatory Visit (HOSPITAL_COMMUNITY)
Admission: RE | Admit: 2016-01-25 | Discharge: 2016-01-25 | Disposition: A | Discharge status: Home / Self Care | Payer: Medicaid Other | Source: Ambulatory Visit | Attending: Certified Nurse Midwife | Admitting: Certified Nurse Midwife

## 2016-01-25 ENCOUNTER — Ambulatory Visit (INDEPENDENT_AMBULATORY_CARE_PROVIDER_SITE_OTHER): Payer: Medicaid Other | Admitting: Certified Nurse Midwife

## 2016-01-25 ENCOUNTER — Encounter (HOSPITAL_COMMUNITY): Payer: Self-pay

## 2016-01-25 VITALS — BP 135/84 | HR 97 | Temp 98.5°F | Wt 207.0 lb

## 2016-01-25 DIAGNOSIS — O0993 Supervision of high risk pregnancy, unspecified, third trimester: Secondary | ICD-10-CM

## 2016-01-25 DIAGNOSIS — O24410 Gestational diabetes mellitus in pregnancy, diet controlled: Secondary | ICD-10-CM | POA: Diagnosis not present

## 2016-01-25 DIAGNOSIS — O24419 Gestational diabetes mellitus in pregnancy, unspecified control: Secondary | ICD-10-CM | POA: Diagnosis not present

## 2016-01-25 DIAGNOSIS — Z3A32 32 weeks gestation of pregnancy: Secondary | ICD-10-CM | POA: Diagnosis not present

## 2016-01-25 DIAGNOSIS — O99213 Obesity complicating pregnancy, third trimester: Secondary | ICD-10-CM | POA: Diagnosis not present

## 2016-01-25 DIAGNOSIS — Z3403 Encounter for supervision of normal first pregnancy, third trimester: Secondary | ICD-10-CM

## 2016-01-25 DIAGNOSIS — Z36 Encounter for antenatal screening of mother: Secondary | ICD-10-CM | POA: Insufficient documentation

## 2016-01-25 LAB — POCT URINALYSIS DIPSTICK
Bilirubin, UA: NEGATIVE
Glucose, UA: 250
LEUKOCYTES UA: NEGATIVE
PH UA: 6
Spec Grav, UA: 1.015
Urobilinogen, UA: NEGATIVE

## 2016-01-25 MED ORDER — METFORMIN HCL 500 MG PO TABS: 500.0000 mg | ORAL_TABLET | Freq: Two times a day (BID) | ORAL | Status: DC

## 2016-01-25 NOTE — Progress Notes (Signed)
Subjective:    Savannah Marsh is a 25 y.o. female being seen today for her obstetrical visit. She is at 5995w4d gestation. Patient reports no complaints. Fetal movement: normal.  Problem List Items Addressed This Visit    None    Visit Diagnoses    Encounter for supervision of normal first pregnancy in third trimester    -  Primary    Relevant Orders    POCT urinalysis dipstick (Completed)    Supervision of high risk pregnancy in third trimester        Relevant Orders    Culture, OB Urine    Gestational diabetes mellitus in third trimester, unspecified diabetic control        Relevant Medications    metFORMIN (GLUCOPHAGE) 500 MG tablet      Patient Active Problem List   Diagnosis Date Noted  . Supervision of normal first pregnancy in first trimester 08/31/2015   Objective:    BP 135/84 mmHg  Pulse 97  Temp(Src) 98.5 F (36.9 C)  Wt 207 lb (93.895 kg)  LMP 06/11/2015 FHT:  150 BPM  Uterine Size: unable to determine d/t body habitus  Presentation: cephalic    NST: + accels, no decels, moderate variability, Cat. 1 tracing. No contractions on toco.   Assessment:    Pregnancy @ 7795w4d weeks   GDM: uncontrolled, glucosuria: started on Metformin  Reactive NST Plan:     labs reviewed, problem list updated Consent signed. GBS planning TDAP offered  Rhogam given for RH negative Pediatrician: discussed. Infant feeding: plans to breastfeed. Maternity leave: discussed. Cigarette smoking: never smoked. Orders Placed This Encounter  Procedures  . Culture, OB Urine  . POCT urinalysis dipstick   Meds ordered this encounter  Medications  . metFORMIN (GLUCOPHAGE) 500 MG tablet    Sig: Take 1 tablet (500 mg total) by mouth 2 (two) times daily with a meal.    Dispense:  60 tablet    Refill:  2   Follow up in 1 Week with NST.

## 2016-01-27 LAB — URINE CULTURE, OB REFLEX

## 2016-01-27 LAB — CULTURE, OB URINE

## 2016-02-03 ENCOUNTER — Encounter: Payer: Medicaid Other | Admitting: Certified Nurse Midwife

## 2016-02-08 ENCOUNTER — Ambulatory Visit (INDEPENDENT_AMBULATORY_CARE_PROVIDER_SITE_OTHER): Payer: Medicaid Other | Admitting: Certified Nurse Midwife

## 2016-02-08 VITALS — BP 140/87 | HR 105 | Temp 97.9°F

## 2016-02-08 DIAGNOSIS — O24419 Gestational diabetes mellitus in pregnancy, unspecified control: Secondary | ICD-10-CM

## 2016-02-08 DIAGNOSIS — Z3403 Encounter for supervision of normal first pregnancy, third trimester: Secondary | ICD-10-CM

## 2016-02-08 LAB — POCT URINALYSIS DIPSTICK
Bilirubin, UA: NEGATIVE
Clarity, UA: NEGATIVE
Glucose, UA: NEGATIVE
Leukocytes, UA: NEGATIVE
Nitrite, UA: NEGATIVE
RBC UA: NEGATIVE
SPEC GRAV UA: 1.015
UROBILINOGEN UA: NEGATIVE
pH, UA: 6

## 2016-02-08 NOTE — Progress Notes (Signed)
Subjective:    Savannah Marsh is a 25 y.o. female being seen today for her obstetrical visit. She is at 173w4d gestation. Patient reports contractions since last week (mainly at night) and clear vaginal discharge. Fetal movement: normal.  Has not checked CBG this week, is taking Metformin BID with meals.  Previous week CBGs 150-160s.  Does not want any type of pain relief during pregnancy (Epidural, Nitrous, IV pain medication, etc).  Problem List Items Addressed This Visit    None    Visit Diagnoses    Encounter for supervision of normal first pregnancy in third trimester    -  Primary    Relevant Orders    POCT urinalysis dipstick (Completed)      Patient Active Problem List   Diagnosis Date Noted  . Supervision of normal first pregnancy in first trimester 08/31/2015   Objective:    BP 140/87 mmHg  Pulse 105  Temp(Src) 97.9 F (36.6 C)  LMP 06/11/2015 FHT:  140 BPM  Uterine Size: unable to determine due to body habitus  Presentation: cephalic   NST +accels 10x10, no decels, moderate variability with prolonged time, occasional contractions, Cat 1 tracing.  Assessment:    Pregnancy @ 293w4d weeks  Reactive NST Uncontrolled GDM. Plan:      labs reviewed, problem list updated Consent signed. GBS sent TDAP offered  Rhogam given for RH negative Pediatrician: discussed. Infant feeding: plans to breastfeed. Maternity leave: discussed. Cigarette smoking: quit when she became pregnant.  Increase Metformin to 750mg  BID.  Orders Placed This Encounter  Procedures  . POCT urinalysis dipstick   No orders of the defined types were placed in this encounter.   Follow up in 1 Week with NST.

## 2016-02-08 NOTE — Progress Notes (Signed)
I agree with note by NP Student Andrew Brake.  Was present for exam.  R.Denney CNM 

## 2016-02-15 ENCOUNTER — Ambulatory Visit (INDEPENDENT_AMBULATORY_CARE_PROVIDER_SITE_OTHER): Payer: Medicaid Other | Admitting: Certified Nurse Midwife

## 2016-02-15 VITALS — BP 133/88 | HR 102 | Wt 208.0 lb

## 2016-02-15 DIAGNOSIS — O24419 Gestational diabetes mellitus in pregnancy, unspecified control: Secondary | ICD-10-CM | POA: Diagnosis not present

## 2016-02-15 DIAGNOSIS — Z3403 Encounter for supervision of normal first pregnancy, third trimester: Secondary | ICD-10-CM

## 2016-02-15 LAB — POCT URINALYSIS DIPSTICK
Bilirubin, UA: NEGATIVE
Glucose, UA: 50
Leukocytes, UA: NEGATIVE
Nitrite, UA: NEGATIVE
PH UA: 6
PROTEIN UA: NEGATIVE
RBC UA: NEGATIVE
UROBILINOGEN UA: NEGATIVE

## 2016-02-15 NOTE — Progress Notes (Signed)
Subjective:    Savannah Marsh is a 25 y.o. female being seen today for her obstetrical visit. She is at 5010w4d gestation. Patient reports no complaints. Fetal movement: normal. Reports fasting BS around 90, highest 2 hour PP was 153, otherwise states they have been around 120.    Problem List Items Addressed This Visit    None    Visit Diagnoses    Encounter for supervision of normal first pregnancy in third trimester    -  Primary    Relevant Orders    POCT urinalysis dipstick (Completed)      Patient Active Problem List   Diagnosis Date Noted  . Supervision of normal first pregnancy in first trimester 08/31/2015   Objective:    BP 133/88 mmHg  Pulse 102  Wt 208 lb (94.348 kg)  LMP 06/11/2015 FHT:  145 BPM  Uterine Size: unable to determine d/t body habitus  Presentation: cephalic    NST: + accels, no decels, moderate variability, Cat. 1 tracing. No contractions on toco.    Cervix:  2 cm, anterior, 50%, ballotable   Assessment:    Pregnancy @ 5010w4d weeks   GDM: on metformin  NST reactive  Plan:     labs reviewed, problem list updated Consent signed. GBS sent TDAP offered  Rhogam given for RH negative Pediatrician: discussed. Infant feeding: plans to breastfeed. Maternity leave: discussed. Cigarette smoking: never smoked. Orders Placed This Encounter  Procedures  . POCT urinalysis dipstick   No orders of the defined types were placed in this encounter.   Follow up in 1 Week with NST for GDM.

## 2016-02-17 ENCOUNTER — Encounter (HOSPITAL_COMMUNITY): Payer: Self-pay | Admitting: *Deleted

## 2016-02-17 ENCOUNTER — Inpatient Hospital Stay (HOSPITAL_COMMUNITY)
Admission: AD | Admit: 2016-02-17 | Discharge: 2016-02-17 | Disposition: A | Discharge status: Home / Self Care | Payer: Medicaid Other | Source: Ambulatory Visit | Attending: Obstetrics | Admitting: Obstetrics

## 2016-02-17 DIAGNOSIS — O4703 False labor before 37 completed weeks of gestation, third trimester: Secondary | ICD-10-CM | POA: Diagnosis not present

## 2016-02-17 DIAGNOSIS — Z3A35 35 weeks gestation of pregnancy: Secondary | ICD-10-CM | POA: Diagnosis not present

## 2016-02-17 DIAGNOSIS — Z87891 Personal history of nicotine dependence: Secondary | ICD-10-CM | POA: Insufficient documentation

## 2016-02-17 LAB — URINALYSIS, ROUTINE W REFLEX MICROSCOPIC
Bilirubin Urine: NEGATIVE
GLUCOSE, UA: NEGATIVE mg/dL
KETONES UR: 15 mg/dL — AB
LEUKOCYTES UA: NEGATIVE
Nitrite: NEGATIVE
PROTEIN: NEGATIVE mg/dL
Specific Gravity, Urine: 1.015 (ref 1.005–1.030)
pH: 6 (ref 5.0–8.0)

## 2016-02-17 LAB — URINE MICROSCOPIC-ADD ON

## 2016-02-17 LAB — OB RESULTS CONSOLE GC/CHLAMYDIA
CHLAMYDIA, DNA PROBE: NEGATIVE
GC PROBE AMP, GENITAL: NEGATIVE

## 2016-02-17 LAB — STREP GP B NAA: STREP GROUP B AG: NEGATIVE

## 2016-02-17 NOTE — MAU Note (Signed)
Patient presents at [redacted] weeks gestation with c/o contractions every 5 minutes X 30 minutes. Fetus active. Denies bleeding but notes a discharge X 2 weeks that her OB is aware of.

## 2016-02-17 NOTE — MAU Provider Note (Signed)
History     CSN: [id number]  Arrival date and time: 02/17/16 0123   None     Chief Complaint  Patient presents with  . Contractions   HPI Comments: Savannah Marsh is a 25 y.o. G1P0 at 4157w6d who presents today with contractions. She states that she has been having about q 5min contractions since around 2330. She rates her pain 6/10 at this time. She denies any VB or LOF. She confirms normal fetal movement.    Past Medical History  Diagnosis Date  . Medical history non-contributory     Past Surgical History  Procedure Laterality Date  . Knee surgery      Family History  Problem Relation Age of Onset  . Stroke Father   . Diabetes Father     Social History  Substance Use Topics  . Smoking status: Former Smoker    Quit date: 07/16/2015  . Smokeless tobacco: Never Used  . Alcohol Use: No    Allergies: No Known Allergies  Prescriptions prior to admission  Medication Sig Dispense Refill Last Dose  . hydrocortisone (ANUSOL-HC) 25 MG suppository Place 1 suppository (25 mg total) rectally 2 (two) times daily. 12 suppository 0 Taking  . metFORMIN (GLUCOPHAGE) 500 MG tablet Take 1 tablet (500 mg total) by mouth 2 (two) times daily with a meal. 60 tablet 2 Taking  . ondansetron (ZOFRAN-ODT) 8 MG disintegrating tablet Take 1 tablet (8 mg total) by mouth every 8 (eight) hours as needed for nausea or vomiting. 20 tablet 0 Taking  . Prenatal Vit-Fe Phos-FA-Omega (VITAFOL GUMMIES) 3.33-0.333-34.8 MG CHEW Chew 3 tablets by mouth daily. 90 tablet 12 Taking    Review of Systems  Constitutional: Negative for fever and chills.  Gastrointestinal: Positive for abdominal pain. Negative for nausea, vomiting, diarrhea and constipation.  Genitourinary: Negative for dysuria, urgency and frequency.  Neurological: Negative for headaches.   Physical Exam   Blood pressure 140/86, pulse 103, temperature 97.9 F (36.6 C), temperature source Oral, resp. rate 16, height 5\' 1"  (1.549 m), weight  94.348 kg (208 lb), last menstrual period 06/11/2015.  Physical Exam  Nursing note and vitals reviewed. Constitutional: She is oriented to person, place, and time. She appears well-developed and well-nourished. No distress.  HENT:  Head: Normocephalic.  Cardiovascular: Normal rate.   Respiratory: Effort normal.  GI: Soft.  Genitourinary:  Cervix: 2/50/-1/vtx   Neurological: She is alert and oriented to person, place, and time.  Skin: Skin is warm and dry.  Psychiatric: She has a normal mood and affect.  FHT: 150, moderate with 15x15 accels, no decels Toco: q 5 min contractions.   MAU Course  Procedures  MDM D/W the patient that there has not been any cervical change from when she was last checked in the office. Offered for the patient to walk and then be rechecked v dc home. Patient states that she feels comfortable going home. Offered pain medication to help rest at home. Patient declines.   Assessment and Plan   1. False labor before 37 completed weeks of gestation in third trimester    DC home Comfort measures reviewed  3rd Trimester precautions  PTL precautions  Fetal kick counts RX: none  Return to MAU as needed FU with OB as planned  Follow-up Information    Follow up with HARPER,CHARLES A, MD.   Specialty:  Obstetrics and Gynecology   Why:  As scheduled   Contact information:   [address] [place] [id number] [phone]  Tawnya CrookHogan, Dieter Hane Donovan 02/17/2016, 2:08 AM

## 2016-02-17 NOTE — Discharge Instructions (Signed)
Braxton Hicks Contractions °Contractions of the uterus can occur throughout pregnancy. Contractions are not always a sign that you are in labor.  °WHAT ARE BRAXTON HICKS CONTRACTIONS?  °Contractions that occur before labor are called Braxton Hicks contractions, or false labor. Toward the end of pregnancy (32-34 weeks), these contractions can develop more often and may become more forceful. This is not true labor because these contractions do not result in opening (dilatation) and thinning of the cervix. They are sometimes difficult to tell apart from true labor because these contractions can be forceful and people have different pain tolerances. You should not feel embarrassed if you go to the hospital with false labor. Sometimes, the only way to tell if you are in true labor is for your health care provider to look for changes in the cervix. °If there are no prenatal problems or other health problems associated with the pregnancy, it is completely safe to be sent home with false labor and await the onset of true labor. °HOW CAN YOU TELL THE DIFFERENCE BETWEEN TRUE AND FALSE LABOR? °False Labor °· The contractions of false labor are usually shorter and not as hard as those of true labor.   °· The contractions are usually irregular.   °· The contractions are often felt in the front of the lower abdomen and in the groin.   °· The contractions may go away when you walk around or change positions while lying down.   °· The contractions get weaker and are shorter lasting as time goes on.   °· The contractions do not usually become progressively stronger, regular, and closer together as with true labor.   °True Labor °· Contractions in true labor last 30-70 seconds, become very regular, usually become more intense, and increase in frequency.   °· The contractions do not go away with walking.   °· The discomfort is usually felt in the top of the uterus and spreads to the lower abdomen and low back.   °· True labor can be  determined by your health care provider with an exam. This will show that the cervix is dilating and getting thinner.   °WHAT TO REMEMBER °· Keep up with your usual exercises and follow other instructions given by your health care provider.   °· Take medicines as directed by your health care provider.   °· Keep your regular prenatal appointments.   °· Eat and drink lightly if you think you are going into labor.   °· If Braxton Hicks contractions are making you uncomfortable:   °¨ Change your position from lying down or resting to walking, or from walking to resting.   °¨ Sit and rest in a tub of warm water.   °¨ Drink 2-3 glasses of water. Dehydration may cause these contractions.   °¨ Do slow and deep breathing several times an hour.   °WHEN SHOULD I SEEK IMMEDIATE MEDICAL CARE? °Seek immediate medical care if: °· Your contractions become stronger, more regular, and closer together.   °· You have fluid leaking or gushing from your vagina.   °· You have a fever.   °· You pass blood-tinged mucus.   °· You have vaginal bleeding.   °· You have continuous abdominal pain.   °· You have low back pain that you never had before.   °· You feel your baby's head pushing down and causing pelvic pressure.   °· Your baby is not moving as much as it used to.   °  °This information is not intended to replace advice given to you by your health care provider. Make sure you discuss any questions you have with your health care   provider. °  °Document Released: 08/07/2005 Document Revised: 08/12/2013 Document Reviewed: 05/19/2013 °Elsevier Interactive Patient Education ©2016 Else[employer] Inc[employer] ° °Preterm Labor Information °Preterm labor is when labor starts at less than 37 weeks of pregnancy. The normal length of a pregnancy is 39 to 41 weeks. °CAUSES °Often, there is no identifiable underlying cause as to why a woman goes into preterm labor. One of the most common known causes of preterm labor is infection. Infections of the uterus, cervix,  vagina, amniotic sac, bladder, kidney, or even the lungs (pneumonia) can cause labor to start. Other suspected causes of preterm labor include:  °· Urogenital infections, such as yeast infections and bacterial vaginosis.   °· Uterine abnormalities (uterine shape, uterine septum, fibroids, or bleeding from the placenta).   °· A cervix that has been operated on (it may fail to stay closed).   °· Malformations in the fetus.   °· Multiple gestations (twins, triplets, and so on).   °· Breakage of the amniotic sac.   °RISK FACTORS °· Having a previous history of preterm labor.   °· Having premature rupture of membranes (PROM).   °· Having a placenta that covers the opening of the cervix (placenta previa).   °· Having a placenta that separates from the uterus (placental abruption).   °· Having a cervix that is too weak to hold the fetus in the uterus (incompetent cervix).   °· Having too much fluid in the amniotic sac (polyhydramnios).   °· Taking illegal drugs or smoking while pregnant.   °· Not gaining enough weight while pregnant.   °· Being younger than 18 and older than 24 years old.   °· Having a low socioeconomic status.   °· Being African American. °SYMPTOMS °Signs and symptoms of preterm labor include:  °· Menstrual-like cramps, abdominal pain, or back pain. °· Uterine contractions that are regular, as frequent as six in an hour, regardless of their intensity (may be mild or painful). °· Contractions that start on the top of the uterus and spread down to the lower abdomen and back.   °· A sense of increased pelvic pressure.   °· A watery or bloody mucus discharge that comes from the vagina.   °TREATMENT °Depending on the length of the pregnancy and other circumstances, your health care provider may suggest bed rest. If necessary, there are medicines that can be given to stop contractions and to mature the fetal lungs. If labor happens before 34 weeks of pregnancy, a prolonged hospital stay may be recommended.  Treatment depends on the condition of both you and the fetus.  °WHAT SHOULD YOU DO IF YOU THINK YOU ARE IN PRETERM LABOR? °Call your health care provider right away. You will need to go to the hospital to get checked immediately. °HOW CAN YOU PREVENT PRETERM LABOR IN FUTURE PREGNANCIES? °You should:  °· Stop smoking if you smoke.  °· Maintain healthy weight gain and avoid chemicals and drugs that are not necessary. °· Be watchful for any type of infection. °· Inform your health care provider if you have a known history of preterm labor. °  °This information is not intended to replace advice given to you by your health care provider. Make sure you discuss any questions you have with your health care provider. °  °Document Released: 10/28/2003 Document Revised: 04/09/2013 Document Reviewed: 09/09/2012 °Elsevier Interactive Patient Education ©2016 Elsevier Inc. ° °

## 2016-02-18 ENCOUNTER — Other Ambulatory Visit (HOSPITAL_COMMUNITY): Payer: Self-pay | Admitting: Certified Nurse Midwife

## 2016-02-18 DIAGNOSIS — B9689 Other specified bacterial agents as the cause of diseases classified elsewhere: Secondary | ICD-10-CM

## 2016-02-18 DIAGNOSIS — N760 Acute vaginitis: Principal | ICD-10-CM

## 2016-02-18 MED ORDER — METRONIDAZOLE 500 MG PO TABS: 500.0000 mg | ORAL_TABLET | Freq: Two times a day (BID) | ORAL | Status: AC

## 2016-02-20 LAB — NUSWAB VG+, CANDIDA 6SP
CANDIDA ALBICANS, NAA: NEGATIVE
CANDIDA GLABRATA, NAA: NEGATIVE
CANDIDA LUSITANIAE, NAA: NEGATIVE
CHLAMYDIA TRACHOMATIS, NAA: NEGATIVE
Candida krusei, NAA: NEGATIVE
Candida parapsilosis, NAA: NEGATIVE
Candida tropicalis, NAA: NEGATIVE
MEGASPHAERA 1: HIGH {score} — AB
NEISSERIA GONORRHOEAE, NAA: NEGATIVE
TRICH VAG BY NAA: NEGATIVE

## 2016-02-23 ENCOUNTER — Ambulatory Visit (INDEPENDENT_AMBULATORY_CARE_PROVIDER_SITE_OTHER): Payer: Medicaid Other | Admitting: Certified Nurse Midwife

## 2016-02-23 VITALS — BP 126/84 | HR 92 | Wt 206.0 lb

## 2016-02-23 DIAGNOSIS — Z3403 Encounter for supervision of normal first pregnancy, third trimester: Secondary | ICD-10-CM

## 2016-02-23 DIAGNOSIS — O24419 Gestational diabetes mellitus in pregnancy, unspecified control: Secondary | ICD-10-CM

## 2016-02-23 LAB — POCT URINALYSIS DIPSTICK
Bilirubin, UA: NEGATIVE
Blood, UA: NEGATIVE
GLUCOSE UA: NEGATIVE
LEUKOCYTES UA: NEGATIVE
Nitrite, UA: NEGATIVE
SPEC GRAV UA: 1.015
UROBILINOGEN UA: NEGATIVE
pH, UA: 6

## 2016-02-23 NOTE — Progress Notes (Signed)
Subjective:    Savannah Marsh is a 25 y.o. female being seen today for her obstetrical visit. She is at 5827w5d gestation. Patient reports no complaints. Fetal movement: normal.  Problem List Items Addressed This Visit    None     Patient Active Problem List   Diagnosis Date Noted  . Supervision of normal first pregnancy in first trimester 08/31/2015   Objective:    BP 126/84 mmHg  Pulse 92  Wt 206 lb (93.441 kg)  LMP 06/11/2015 FHT:  140 BPM  Uterine Size: size greater than dates  Presentation: cephalic   Cervix: 2.5 cm, anterior, soft, 50%, -3 station  NST: + accels, no decels, moderate variability, Cat. 1 tracing. No contractions on toco.   Assessment:    Pregnancy @ 127w5d weeks   GDM: stable with metformin   Reactive NST: occasional contraction  Plan:     labs reviewed, problem list updated Consent signed. GBS results reviewed TDAP offered  Rhogam given for RH negative Pediatrician: discussed. Infant feeding: plans to breastfeed. Maternity leave: discussed. Cigarette smoking: never smoked. No orders of the defined types were placed in this encounter.   No orders of the defined types were placed in this encounter.   Follow up in 1 Week with NST.

## 2016-02-25 ENCOUNTER — Inpatient Hospital Stay (HOSPITAL_COMMUNITY): Payer: Medicaid Other

## 2016-02-25 ENCOUNTER — Inpatient Hospital Stay (HOSPITAL_COMMUNITY)
Admission: AD | Admit: 2016-02-25 | Discharge: 2016-02-25 | Disposition: A | Discharge status: Home / Self Care | Payer: Medicaid Other | Source: Ambulatory Visit | Attending: Obstetrics & Gynecology | Admitting: Obstetrics & Gynecology

## 2016-02-25 ENCOUNTER — Encounter (HOSPITAL_COMMUNITY): Payer: Self-pay | Admitting: *Deleted

## 2016-02-25 DIAGNOSIS — Z3A37 37 weeks gestation of pregnancy: Secondary | ICD-10-CM | POA: Diagnosis not present

## 2016-02-25 DIAGNOSIS — IMO0002 Reserved for concepts with insufficient information to code with codable children: Secondary | ICD-10-CM

## 2016-02-25 DIAGNOSIS — O4693 Antepartum hemorrhage, unspecified, third trimester: Secondary | ICD-10-CM

## 2016-02-25 DIAGNOSIS — O471 False labor at or after 37 completed weeks of gestation: Secondary | ICD-10-CM | POA: Insufficient documentation

## 2016-02-25 NOTE — Discharge Instructions (Signed)
Pelvic Rest Pelvic rest is sometimes recommended for women when:   The placenta is partially or completely covering the opening of the cervix (placenta previa).  There is bleeding between the uterine wall and the amniotic sac in the first trimester (subchorionic hemorrhage).  The cervix begins to open without labor starting (incompetent cervix, cervical insufficiency).  The labor is too early (preterm labor). HOME CARE INSTRUCTIONS  Do not have sexual intercourse, stimulation, or an orgasm.  Do not use tampons, douche, or put anything in the vagina.  Do not lift anything over 10 pounds (4.5 kg).  Avoid strenuous activity or straining your pelvic muscles. SEEK MEDICAL CARE IF:  You have any vaginal bleeding during pregnancy. Treat this as a potential emergency.  You have cramping pain felt low in the stomach (stronger than menstrual cramps).  You notice vaginal discharge (watery, mucus, or bloody).  You have a low, dull backache.  There are regular contractions or uterine tightening. SEEK IMMEDIATE MEDICAL CARE IF: You have vaginal bleeding and have placenta previa.    This information is not intended to replace advice given to you by your health care provider. Make sure you discuss any questions you have with your health care provider.   Document Released: 12/02/2010 Document Revised: 10/30/2011 Document Reviewed: 02/08/2015 Elsevier Interactive Patient Education 2016 [employer] Vaginal Delivery During delivery, your health care provider will help you give birth to your baby. During a vaginal delivery, you will work to push the baby out of your vagina. However, before you can push your baby out, a few things need to happen. The opening of your uterus (cervix) has to soften, thin out, and open up (dilate) all the way to 10 cm. Also, your baby has to move down from the uterus into your vagina.  SIGNS OF LABOR  Your health care provider will first need to make sure you are  in labor. Signs of labor include:   Passing what is called the mucous plug before labor begins. This is a small amount of blood-stained mucus.  Having regular, painful uterine contractions.   The time between contractions gets shorter.   The discomfort and pain gradually get more intense.  Contraction pains get worse when walking and do not go away when resting.   Your cervix becomes thinner (effacement) and dilates. BEFORE THE DELIVERY Once you are in labor and admitted into the hospital or care center, your health care provider may do the following:   Perform a complete physical exam.  Review any complications related to pregnancy or labor.  Check your blood pressure, pulse, temperature, and heart rate (vital signs).   Determine if, and when, the rupture of amniotic membranes occurred.  Do a vaginal exam (using a sterile glove and lubricant) to determine:   The position (presentation) of the baby. Is the baby's head presenting first (vertex) in the birth canal (vagina), or are the feet or buttocks first (breech)?   The level (station) of the baby's head within the birth canal.   The effacement and dilatation of the cervix.   An electronic fetal monitor is usually placed on your abdomen when you first arrive. This is used to monitor your contractions and the baby's heart rate.  When the monitor is on your abdomen (external fetal monitor), it can only pick up the frequency and length of your contractions. It cannot tell the strength of your contractions.  If it becomes necessary for your health care provider to know exactly how strong your  contractions are or to see exactly what the baby's heart rate is doing, an internal monitor may be inserted into your vagina and uterus. Your health care provider will discuss the benefits and risks of using an internal monitor and obtain your permission before inserting the device.  Continuous fetal monitoring may be needed if you  have an epidural, are receiving certain medicines (such as oxytocin), or have pregnancy or labor complications.  An IV access tube may be placed into a vein in your arm to deliver fluids and medicines if necessary. THREE STAGES OF LABOR AND DELIVERY Normal labor and delivery is divided into three stages. First Stage This stage starts when you begin to contract regularly and your cervix begins to efface and dilate. It ends when your cervix is completely open (fully dilated). The first stage is the longest stage of labor and can last from 3 hours to 15 hours.  Several methods are available to help with labor pain. You and your health care provider will decide which option is best for you. Options include:   Opioid medicines. These are strong pain medicines that you can get through your IV tube or as a shot into your muscle. These medicines lessen pain but do not make it go away completely.  Epidural. A medicine is given through a thin tube that is inserted in your back. The medicine numbs the lower part of your body and prevents any pain in that area.  Paracervical pain medicine. This is an injection of an anesthetic on each side of your cervix.   You may request natural childbirth, which does not involve the use of pain medicines or an epidural during labor and delivery. Instead, you will use other things, such as breathing exercises, to help cope with the pain. Second Stage The second stage of labor begins when your cervix is fully dilated at 10 cm. It continues until you push your baby down through the birth canal and the baby is born. This stage can take only minutes or several hours.  The location of your baby's head as it moves through the birth canal is reported as a number called a station. If the baby's head has not started its descent, the station is described as being at minus 3 (-3). When your baby's head is at the zero station, it is at the middle of the birth canal and is engaged in  the pelvis. The station of your baby helps indicate the progress of the second stage of labor.  When your baby is born, your health care provider may hold the baby with his or her head lowered to prevent amniotic fluid, mucus, and blood from getting into the baby's lungs. The baby's mouth and nose may be suctioned with a small bulb syringe to remove any additional fluid.  Your health care provider may then place the baby on your stomach. It is important to keep the baby from getting cold. To do this, the health care provider will dry the baby off, place the baby directly on your skin (with no blankets between you and the baby), and cover the baby with warm, dry blankets.   The umbilical cord is cut. Third Stage During the third stage of labor, your health care provider will deliver the placenta (afterbirth) and make sure your bleeding is under control. The delivery of the placenta usually takes about 5 minutes but can take up to 30 minutes. After the placenta is delivered, a medicine may be given either by  IV or injection to help contract the uterus and control bleeding. If you are planning to breastfeed, you can try to do so now. After you deliver the placenta, your uterus should contract and get very firm. If your uterus does not remain firm, your health care provider will massage it. This is important because the contraction of the uterus helps cut off bleeding at the site where the placenta was attached to your uterus. If your uterus does not contract properly and stay firm, you may continue to bleed heavily. If there is a lot of bleeding, medicines may be given to contract the uterus and stop the bleeding.    This information is not intended to replace advice given to you by your health care provider. Make sure you discuss any questions you have with your health care provider.   Document Released: 05/16/2008 Document Revised: 08/28/2014 Document Reviewed: 04/03/2012 Elsevier Interactive Patient  Education [employer]

## 2016-02-25 NOTE — MAU Note (Addendum)
Membranes stripped on Wed.  Had some bleeding that day. None yesterday.  Today noted bleeding everytime she wipes, lt red- pinkish.  Got scared when she saw a pea sized clot today. "stretched her out to 3 cm"

## 2016-02-25 NOTE — MAU Provider Note (Signed)
Chief Complaint:  No chief complaint on file.   Provider saw patient at 1750    Savannah Marsh is a 25 y.o. G1P0 at 2837w0dwho presents to maternity admissions reporting pink discharge seen on tissue.  Also has some irregular mild contractions.  Had membranes stripped in office today by CNM. She reports good fetal movement, denies LOF, vaginal bleeding, vaginal itching/burning, urinary symptoms, h/a, dizziness, n/v, diarrhea, constipation or fever/chills.  She denies headache, visual changes or RUQ abdominal pain.   Vaginal Bleeding The patient's primary symptoms include pelvic pain and vaginal bleeding. The patient's pertinent negatives include no genital itching, genital lesions, genital odor or vaginal discharge. This is a new problem. The current episode started today. The problem has been unchanged. The pain is mild. The problem affects both sides. She is pregnant. Associated symptoms include abdominal pain. Pertinent negatives include no chills, dysuria, fever, headaches, nausea or vomiting. The vaginal discharge was bloody (pink). The vaginal bleeding is spotting. She has not been passing clots. She has not been passing tissue. Nothing aggravates the symptoms. She has tried nothing for the symptoms.  RN Note: Membranes stripped on Wed. Had some bleeding that day. None yesterday. Today noted bleeding everytime she wipes, lt red- pinkish. Got scared when she saw a pea sized clot today. "stretched her out to 3 cm"           Past Medical History: Past Medical History  Diagnosis Date  . Medical history non-contributory     Past obstetric history: OB History  Gravida Para Term Preterm AB SAB TAB Ectopic Multiple Living  1             # Outcome Date GA Lbr Len/2nd Weight Sex Delivery Anes PTL Lv  1 Current               Past Surgical History: Past Surgical History  Procedure Laterality Date  . Knee surgery      Family History: Family History  Problem Relation Age of Onset  .  Stroke Father   . Diabetes Father     Social History: Social History  Substance Use Topics  . Smoking status: Former Smoker    Quit date: 07/16/2015  . Smokeless tobacco: Never Used  . Alcohol Use: No    Allergies: No Known Allergies  Meds:  Prescriptions prior to admission  Medication Sig Dispense Refill Last Dose  . hydrocortisone (ANUSOL-HC) 25 MG suppository Place 1 suppository (25 mg total) rectally 2 (two) times daily. 12 suppository 0 Taking  . metFORMIN (GLUCOPHAGE) 500 MG tablet Take 1 tablet (500 mg total) by mouth 2 (two) times daily with a meal. 60 tablet 2 Taking  . metroNIDAZOLE (FLAGYL) 500 MG tablet Take 1 tablet (500 mg total) by mouth 2 (two) times daily. 14 tablet 0   . ondansetron (ZOFRAN-ODT) 8 MG disintegrating tablet Take 1 tablet (8 mg total) by mouth every 8 (eight) hours as needed for nausea or vomiting. 20 tablet 0 Taking  . Prenatal Vit-Fe Phos-FA-Omega (VITAFOL GUMMIES) 3.33-0.333-34.8 MG CHEW Chew 3 tablets by mouth daily. 90 tablet 12 Taking    I have reviewed patient's Past Medical Hx, Surgical Hx, Family Hx, Social Hx, medications and allergies.   ROS:  Review of Systems  Constitutional: Negative for fever, chills and fatigue.  Respiratory: Negative for shortness of breath.   Gastrointestinal: Positive for abdominal pain. Negative for nausea and vomiting.  Genitourinary: Positive for vaginal bleeding and pelvic pain. Negative for dysuria, vaginal discharge and vaginal  pain.  Neurological: Negative for dizziness and headaches.   Other systems negative  Physical Exam  Patient Vitals for the past 24 hrs:  BP Temp Temp src Pulse Resp  02/25/16 1739 135/81 mmHg 98.2 F (36.8 C) Oral 110 18   Constitutional: Well-developed, well-nourished female in no acute distress.  Cardiovascular: normal rate and rhythm Respiratory: normal effort, clear to auscultation bilaterally GI: Abd soft, non-tender, gravid appropriate for gestational age.   No rebound  or guarding. MS: Extremities nontender, no edema, normal ROM Neurologic: Alert and oriented x 4.  GU: Neg CVAT.  PELVIC EXAM: Cervix pink, visually closed, without lesion, scant tan pink creamy discharge, vaginal walls and external genitalia normal   Dilation: 3.5 Effacement (%): 80 Station: Ballotable Presentation: Vertex Exam by:: M.Elliott Lasecki,CNM  FHT:  Baseline 140 , moderate variability, accelerations present, no decelerations except for one variable.   BPP done and was 8/8 Contractions:  Irregular     Labs: No results found for this or any previous visit (from the past 24 hour(s)). A/POS/-- (01/10 1700)  Imaging:  No results found.  MAU Course/MDM: I have ordered labs and reviewed results.  NST reviewed   Assessment: SIUP at 7077w0d Pink show after membrane sweeping Prodromal irregular contractions, not in labor  Plan: Discharge home Labor precautions and fetal kick counts Follow up in Office for prenatal visits and recheck of cervix as needed    Medication List    ASK your doctor about these medications        hydrocortisone 25 MG suppository  Commonly known as:  ANUSOL-HC  Place 1 suppository (25 mg total) rectally 2 (two) times daily.     metFORMIN 500 MG tablet  Commonly known as:  GLUCOPHAGE  Take 1 tablet (500 mg total) by mouth 2 (two) times daily with a meal.     metroNIDAZOLE 500 MG tablet  Commonly known as:  FLAGYL  Take 1 tablet (500 mg total) by mouth 2 (two) times daily.     ondansetron 8 MG disintegrating tablet  Commonly known as:  ZOFRAN ODT  Take 1 tablet (8 mg total) by mouth every 8 (eight) hours as needed for nausea or vomiting.     VITAFOL GUMMIES 3.33-0.333-34.8 MG Chew  Chew 3 tablets by mouth daily.       Pt stable at time of discharge. Encouraged to return here or to other Urgent Care/ED if she develops worsening of symptoms, increase in pain, fever, or other concerning symptoms.      Wynelle BourgeoisMarie Wilho Sharpley CNM, MSN Certified  Nurse-Midwife 02/25/2016 6:01 PM

## 2016-02-29 ENCOUNTER — Ambulatory Visit (INDEPENDENT_AMBULATORY_CARE_PROVIDER_SITE_OTHER): Payer: Medicaid Other | Admitting: Certified Nurse Midwife

## 2016-02-29 VITALS — BP 115/82 | HR 99 | Temp 97.4°F | Wt 209.0 lb

## 2016-02-29 DIAGNOSIS — O24419 Gestational diabetes mellitus in pregnancy, unspecified control: Secondary | ICD-10-CM

## 2016-02-29 DIAGNOSIS — O24414 Gestational diabetes mellitus in pregnancy, insulin controlled: Secondary | ICD-10-CM

## 2016-02-29 DIAGNOSIS — O0993 Supervision of high risk pregnancy, unspecified, third trimester: Secondary | ICD-10-CM

## 2016-02-29 DIAGNOSIS — Z3403 Encounter for supervision of normal first pregnancy, third trimester: Secondary | ICD-10-CM

## 2016-02-29 LAB — POCT URINALYSIS DIPSTICK
Bilirubin, UA: NEGATIVE
Blood, UA: NEGATIVE
Glucose, UA: NEGATIVE
Ketones, UA: NEGATIVE
LEUKOCYTES UA: NEGATIVE
NITRITE UA: NEGATIVE
PH UA: 7
PROTEIN UA: NEGATIVE
Spec Grav, UA: <=1.005
UROBILINOGEN UA: NEGATIVE

## 2016-02-29 NOTE — Progress Notes (Deleted)
Subjective:    Savannah Marsh is a 25 y.o. female being seen today for her obstetrical visit. She is at 6524w4d gestation. Patient reports fatigue, no bleeding, no leaking and occasional contractions. Fetal movement: normal.  Problem List Items Addressed This Visit    None    Visit Diagnoses    Encounter for supervision of normal first pregnancy in third trimester    -  Primary    Relevant Orders    POCT urinalysis dipstick (Completed)      Patient Active Problem List   Diagnosis Date Noted  . Supervision of normal first pregnancy in first trimester 08/31/2015    Objective:    BP 115/82 mmHg  Pulse 99  Temp(Src) 97.4 F (36.3 C)  Wt 209 lb (94.802 kg)  LMP 06/11/2015 FHT: *** BPM  Uterine Size: {fundal height:14540}  Presentations: {fetal pos:14558}  Pelvic Exam:              Dilation: {dilation:14561}       Effacement: {effacement:14523}             Station:  {station:14562}    Consistency: {firm/med/soft:14563}            Position: {post/mid/ant:14564}     Assessment:    Pregnancy @ 2624w4d weeks   Plan:   Plans for delivery: {delivery type:14566}; labs reviewed; problem list updated Counseling: Consent signed. Infant feeding: {bottle/breast feeding:14556}. Cigarette smoking: {never smoked/quit***:14541}. L&D discussion: symptoms of labor, discussed when to call, discussed what number to call, anesthetic/analgesic options reviewed and delivering clinician:  plans {clinician preference:14533}. Postpartum supports and preparation: circumcision discussed and contraception plans discussed.  Follow up in {follow up:14565}.

## 2016-02-29 NOTE — Progress Notes (Signed)
Subjective:    Savannah Marsh is a 25 y.o. female being seen today for her obstetrical visit. She is at 7521w4d gestation. Patient reports fatigue, no bleeding, no cramping and no leaking. Fetal movement: normal.  Reports stable blood sugars.    Problem List Items Addressed This Visit    None    Visit Diagnoses    Encounter for supervision of normal first pregnancy in third trimester    -  Primary    Relevant Orders    POCT urinalysis dipstick (Completed)    Supervision of high risk pregnancy, antepartum, third trimester        Relevant Orders    AMB referral to maternal fetal medicine    US MFM OB FOLLOW UP    Insulin controlled gestational diabetes mellitus in third trimester        Relevant Orders    AMB referral to maternal fetal medicine    US MFM OB FOLLOW UP      Patient Active Problem List   Diagnosis Date Noted  . Supervision of normal first pregnancy in first trimester 08/31/2015    Objective:    BP 115/82 mmHg  Pulse 99  Temp(Src) 97.4 F (36.3 C)  Wt 209 lb (94.802 kg)  LMP 06/11/2015 FHT: 145 BPM  Uterine Size: unable to determine d/t body habitus  Presentations: cephalic  Pelvic Exam: deferred   NST: minimal variability, no accels, 1 slight variable to 120 after slight accel noted, 3 contractions on toco  Assessment:    Pregnancy @ 5921w4d weeks   Non reactive NST  GDM: on metformin  Plan:    MFM consult for POC for delivery. Plans for delivery: Vaginal anticipated; labs reviewed; problem list updated Counseling: Consent signed. Infant feeding: plans to breastfeed. Cigarette smoking: never smoked. L&D discussion: symptoms of labor, discussed when to call, discussed what number to call, anesthetic/analgesic options reviewed and delivering clinician:  plans no preference. Postpartum supports and preparation: circumcision discussed and contraception plans discussed.  Follow up in 1 Week with NST.

## 2016-03-07 ENCOUNTER — Inpatient Hospital Stay (HOSPITAL_COMMUNITY): Payer: Medicaid Other | Admitting: Anesthesiology

## 2016-03-07 ENCOUNTER — Inpatient Hospital Stay (HOSPITAL_COMMUNITY)
Admission: AD | Admit: 2016-03-07 | Discharge: 2016-03-09 | DRG: 775 | Disposition: A | Discharge status: Home / Self Care | Payer: Medicaid Other | Source: Ambulatory Visit | Attending: Obstetrics & Gynecology | Admitting: Obstetrics & Gynecology

## 2016-03-07 ENCOUNTER — Ambulatory Visit (INDEPENDENT_AMBULATORY_CARE_PROVIDER_SITE_OTHER): Payer: Medicaid Other | Admitting: Certified Nurse Midwife

## 2016-03-07 ENCOUNTER — Encounter (HOSPITAL_COMMUNITY): Payer: Self-pay | Admitting: *Deleted

## 2016-03-07 VITALS — BP 138/90 | HR 92 | Wt 210.0 lb

## 2016-03-07 DIAGNOSIS — Z833 Family history of diabetes mellitus: Secondary | ICD-10-CM

## 2016-03-07 DIAGNOSIS — O1494 Unspecified pre-eclampsia, complicating childbirth: Secondary | ICD-10-CM | POA: Diagnosis present

## 2016-03-07 DIAGNOSIS — Z3403 Encounter for supervision of normal first pregnancy, third trimester: Secondary | ICD-10-CM | POA: Diagnosis not present

## 2016-03-07 DIAGNOSIS — Z3A38 38 weeks gestation of pregnancy: Secondary | ICD-10-CM

## 2016-03-07 DIAGNOSIS — Z8632 Personal history of gestational diabetes: Secondary | ICD-10-CM | POA: Diagnosis present

## 2016-03-07 DIAGNOSIS — Z823 Family history of stroke: Secondary | ICD-10-CM | POA: Diagnosis not present

## 2016-03-07 DIAGNOSIS — O24419 Gestational diabetes mellitus in pregnancy, unspecified control: Secondary | ICD-10-CM

## 2016-03-07 DIAGNOSIS — O1404 Mild to moderate pre-eclampsia, complicating childbirth: Secondary | ICD-10-CM | POA: Diagnosis present

## 2016-03-07 DIAGNOSIS — Z3401 Encounter for supervision of normal first pregnancy, first trimester: Secondary | ICD-10-CM

## 2016-03-07 DIAGNOSIS — O24425 Gestational diabetes mellitus in childbirth, controlled by oral hypoglycemic drugs: Principal | ICD-10-CM | POA: Diagnosis present

## 2016-03-07 DIAGNOSIS — O1414 Severe pre-eclampsia complicating childbirth: Secondary | ICD-10-CM | POA: Diagnosis not present

## 2016-03-07 DIAGNOSIS — Z87891 Personal history of nicotine dependence: Secondary | ICD-10-CM

## 2016-03-07 DIAGNOSIS — O1493 Unspecified pre-eclampsia, third trimester: Secondary | ICD-10-CM

## 2016-03-07 HISTORY — DX: Gestational diabetes mellitus in pregnancy, unspecified control: O24.419

## 2016-03-07 LAB — POCT URINALYSIS DIPSTICK
BILIRUBIN UA: NEGATIVE
Glucose, UA: NEGATIVE
Leukocytes, UA: NEGATIVE
NITRITE UA: NEGATIVE
PH UA: 6
RBC UA: NEGATIVE
SPEC GRAV UA: 1.015
UROBILINOGEN UA: NEGATIVE

## 2016-03-07 LAB — CBC
HCT: 38.3 % (ref 36.0–46.0)
Hemoglobin: 12.7 g/dL (ref 12.0–15.0)
MCH: 25.1 pg — AB (ref 26.0–34.0)
MCHC: 33.2 g/dL (ref 30.0–36.0)
MCV: 75.7 fL — AB (ref 78.0–100.0)
PLATELETS: 319 10*3/uL (ref 150–400)
RBC: 5.06 MIL/uL (ref 3.87–5.11)
RDW: 15.5 % (ref 11.5–15.5)
WBC: 12 10*3/uL — ABNORMAL HIGH (ref 4.0–10.5)

## 2016-03-07 LAB — COMPREHENSIVE METABOLIC PANEL
ALT: 15 U/L (ref 14–54)
AST: 16 U/L (ref 15–41)
Albumin: 2.9 g/dL — ABNORMAL LOW (ref 3.5–5.0)
Alkaline Phosphatase: 233 U/L — ABNORMAL HIGH (ref 38–126)
Anion gap: 10 (ref 5–15)
BILIRUBIN TOTAL: 0.3 mg/dL (ref 0.3–1.2)
BUN: 6 mg/dL (ref 6–20)
CHLORIDE: 103 mmol/L (ref 101–111)
CO2: 21 mmol/L — ABNORMAL LOW (ref 22–32)
CREATININE: 0.61 mg/dL (ref 0.44–1.00)
Calcium: 9.4 mg/dL (ref 8.9–10.3)
Glucose, Bld: 136 mg/dL — ABNORMAL HIGH (ref 65–99)
POTASSIUM: 4 mmol/L (ref 3.5–5.1)
Sodium: 134 mmol/L — ABNORMAL LOW (ref 135–145)
TOTAL PROTEIN: 6.8 g/dL (ref 6.5–8.1)

## 2016-03-07 LAB — TYPE AND SCREEN
ABO/RH(D): A POS
Antibody Screen: NEGATIVE

## 2016-03-07 LAB — PROTEIN / CREATININE RATIO, URINE
Creatinine, Urine: 167 mg/dL
Protein Creatinine Ratio: 0.5 mg/mg{Cre} — ABNORMAL HIGH (ref 0.00–0.15)
Total Protein, Urine: 83 mg/dL

## 2016-03-07 LAB — OB RESULTS CONSOLE GBS: GBS: NEGATIVE

## 2016-03-07 LAB — GLUCOSE, CAPILLARY
GLUCOSE-CAPILLARY: 75 mg/dL (ref 65–99)
GLUCOSE-CAPILLARY: 79 mg/dL (ref 65–99)
Glucose-Capillary: 109 mg/dL — ABNORMAL HIGH (ref 65–99)

## 2016-03-07 MED ORDER — LIDOCAINE HCL (PF) 1 % IJ SOLN
INTRAMUSCULAR | Status: DC | PRN
  Administered 2016-03-07 (×2): 5 mL

## 2016-03-07 MED ORDER — OXYCODONE-ACETAMINOPHEN 5-325 MG PO TABS: 1.0000 | ORAL_TABLET | ORAL | Status: DC | PRN

## 2016-03-07 MED ORDER — EPHEDRINE 5 MG/ML INJ
10.0000 mg | INTRAVENOUS | Status: DC | PRN
  Filled 2016-03-07: qty 2

## 2016-03-07 MED ORDER — ONDANSETRON HCL 4 MG/2ML IJ SOLN: 4.0000 mg | Freq: Four times a day (QID) | INTRAMUSCULAR | Status: DC | PRN

## 2016-03-07 MED ORDER — ACETAMINOPHEN 325 MG PO TABS: 650.0000 mg | ORAL_TABLET | ORAL | Status: DC | PRN

## 2016-03-07 MED ORDER — LACTATED RINGERS IV SOLN
500.0000 mL | INTRAVENOUS | Status: DC | PRN
  Administered 2016-03-07 – 2016-03-08 (×2): 300 mL via INTRAVENOUS

## 2016-03-07 MED ORDER — FLEET ENEMA 7-19 GM/118ML RE ENEM: 1.0000 | ENEMA | RECTAL | Status: DC | PRN

## 2016-03-07 MED ORDER — LACTATED RINGERS IV SOLN
INTRAVENOUS | Status: DC
  Administered 2016-03-07: 19:00:00 via INTRAVENOUS

## 2016-03-07 MED ORDER — OXYTOCIN BOLUS FROM INFUSION
500.0000 mL | INTRAVENOUS | Status: DC
  Administered 2016-03-08: 04:00:00 via INTRAVENOUS

## 2016-03-07 MED ORDER — PHENYLEPHRINE 40 MCG/ML (10ML) SYRINGE FOR IV PUSH (FOR BLOOD PRESSURE SUPPORT)
80.0000 ug | PREFILLED_SYRINGE | INTRAVENOUS | Status: DC | PRN
  Filled 2016-03-07: qty 10
  Filled 2016-03-07: qty 5

## 2016-03-07 MED ORDER — OXYTOCIN 40 UNITS IN LACTATED RINGERS INFUSION - SIMPLE MED
1.0000 m[IU]/min | INTRAVENOUS | Status: DC
  Administered 2016-03-07: 2 m[IU]/min via INTRAVENOUS
  Filled 2016-03-07: qty 1000

## 2016-03-07 MED ORDER — DIPHENHYDRAMINE HCL 50 MG/ML IJ SOLN: 12.5000 mg | INTRAMUSCULAR | Status: DC | PRN

## 2016-03-07 MED ORDER — LACTATED RINGERS IV SOLN
INTRAVENOUS | Status: DC
  Administered 2016-03-07: 13:00:00 via INTRAVENOUS

## 2016-03-07 MED ORDER — SOD CITRATE-CITRIC ACID 500-334 MG/5ML PO SOLN: 30.0000 mL | ORAL | Status: DC | PRN

## 2016-03-07 MED ORDER — OXYTOCIN 40 UNITS IN LACTATED RINGERS INFUSION - SIMPLE MED: 2.5000 [IU]/h | INTRAVENOUS | Status: DC

## 2016-03-07 MED ORDER — LIDOCAINE HCL (PF) 1 % IJ SOLN
30.0000 mL | INTRAMUSCULAR | Status: DC | PRN
  Filled 2016-03-07 (×2): qty 30

## 2016-03-07 MED ORDER — LACTATED RINGERS IV SOLN
500.0000 mL | Freq: Once | INTRAVENOUS | Status: AC
  Administered 2016-03-07: 22:00:00 via INTRAVENOUS

## 2016-03-07 MED ORDER — FENTANYL 2.5 MCG/ML BUPIVACAINE 1/10 % EPIDURAL INFUSION (WH - ANES)
14.0000 mL/h | INTRAMUSCULAR | Status: DC | PRN
  Administered 2016-03-07: 14 mL/h via EPIDURAL
  Filled 2016-03-07: qty 125

## 2016-03-07 MED ORDER — OXYCODONE-ACETAMINOPHEN 5-325 MG PO TABS: 2.0000 | ORAL_TABLET | ORAL | Status: DC | PRN

## 2016-03-07 MED ORDER — FENTANYL CITRATE (PF) 100 MCG/2ML IJ SOLN: 100.0000 ug | INTRAMUSCULAR | Status: DC | PRN

## 2016-03-07 NOTE — Anesthesia Procedure Notes (Signed)
Epidural Patient location during procedure: OB  Staffing Anesthesiologist: Phillips GroutARIGNAN, Brayant Dorr Performed by: anesthesiologist   Preanesthetic Checklist Completed: patient identified, site marked, surgical consent, pre-op evaluation, timeout performed, IV checked, risks and benefits discussed and monitors and equipment checked  Epidural Patient position: sitting Prep: DuraPrep Patient monitoring: heart rate, continuous pulse ox and blood pressure Approach: left paramedian Location: L3-L4 Injection technique: LOR saline  Needle:  Needle type: Tuohy  Needle gauge: 17 G Needle length: 9 cm and 9 Needle insertion depth: 6 cm Catheter type: closed end flexible Catheter size: 20 Guage Catheter at skin depth: 10 cm Test dose: negative  Assessment Events: blood not aspirated, injection not painful, no injection resistance, negative IV test and no paresthesia  Additional Notes Patient identified. Risks/Benefits/Options discussed with patient including but not limited to bleeding, infection, nerve damage, paralysis, failed block, incomplete pain control, headache, blood pressure changes, nausea, vomiting, reactions to medication both or allergic, itching and postpartum back pain. Confirmed with bedside nurse the patient's most recent platelet count. Confirmed with patient that they are not currently taking any anticoagulation, have any bleeding history or any family history of bleeding disorders. Patient expressed understanding and wished to proceed. All questions were answered. Sterile technique was used throughout the entire procedure. Please see nursing notes for vital signs. Test dose was given through epidural needle and negative prior to continuing to dose epidural or start infusion. Warning signs of high block given to the patient including shortness of breath, tingling/numbness in hands, complete motor block, or any concerning symptoms with instructions to call for help. Patient was given  instructions on fall risk and not to get out of bed. All questions and concerns addressed with instructions to call with any issues.

## 2016-03-07 NOTE — Anesthesia Preprocedure Evaluation (Signed)
Anesthesia Evaluation  Patient identified by MRN, date of birth, ID band Patient awake    Reviewed: Allergy & Precautions, H&P , NPO status , Patient's Chart, lab work & pertinent test results  History of Anesthesia Complications Negative for: history of anesthetic complications  Airway Mallampati: II  TM Distance: >3 FB Neck ROM: full    Dental no notable dental hx. (+) Teeth Intact   Pulmonary neg pulmonary ROS, former smoker,    Pulmonary exam normal breath sounds clear to auscultation       Cardiovascular negative cardio ROS Normal cardiovascular exam Rhythm:regular Rate:Normal     Neuro/Psych negative neurological ROS  negative psych ROS   GI/Hepatic negative GI ROS, Neg liver ROS,   Endo/Other  negative endocrine ROSdiabetes, Gestational  Renal/GU negative Renal ROS  negative genitourinary   Musculoskeletal   Abdominal   Peds  Hematology negative hematology ROS (+)   Anesthesia Other Findings   Reproductive/Obstetrics (+) Pregnancy                             Anesthesia Physical Anesthesia Plan  ASA: II  Anesthesia Plan: Epidural   Post-op Pain Management:    Induction:   Airway Management Planned:   Additional Equipment:   Intra-op Plan:   Post-operative Plan:   Informed Consent: I have reviewed the patients History and Physical, chart, labs and discussed the procedure including the risks, benefits and alternatives for the proposed anesthesia with the patient or authorized representative who has indicated his/her understanding and acceptance.     Plan Discussed with:   Anesthesia Plan Comments:         Anesthesia Quick Evaluation

## 2016-03-07 NOTE — Progress Notes (Signed)
I agree with note by NP Student Peri MarisAndrew Brake.  Performed cervical exam.  R.Yatzary Merriweather CNM   Cervix:  3 cm, medium, 50% effaced, -3 station  Sent to MAU for evaluation of elevated blood pressures today in office with 1+ ketones, and contractions at term.  GDM stable with metformin.  NST reactive.

## 2016-03-07 NOTE — Progress Notes (Signed)
Subjective:    Savannah Marsh is a 25 y.o. female being seen today for her obstetrical visit. She is at 3732w4d gestation. Patient reports strong contractions since Saturday (03-04-2016), fatigue and small spots of blood when she wipes. Fetal movement: normal.  Reports FBS @ 96 and other CBGs 120-140.  Problem List Items Addressed This Visit    None     Patient Active Problem List   Diagnosis Date Noted  . Supervision of normal first pregnancy in first trimester 08/31/2015    Objective:    LMP 06/11/2015 FHT: 140 BPM  Uterine Size: unable to determine d/t body habitus  Presentations: cephalic  Pelvic Exam:              Dilation: 3cm       Effacement: 50%             Station:  -3    Consistency: medium            Position: middle     Assessment:    Pregnancy @ 232w4d weeks  Reactive NST, contractions every 6-10 minutes. GDM: on Metformin Elevated B/P. Plan:    MFM consult for POC for delivery. To MAU for elevated B/P & proteinuria. Plans for delivery: Vaginal anticipated; labs reviewed; problem list updated Counseling: Consent signed. Infant feeding: plans to breastfeed. Cigarette smoking: never smoked. L&D discussion: symptoms of labor, discussed when to call, discussed what number to call, anesthetic/analgesic options reviewed and delivering clinician: plans no preference. Postpartum supports and preparation: circumcision discussed and contraception plans discussed.  Follow up in 1 Week with NST.

## 2016-03-07 NOTE — H&P (Signed)
Savannah Marsh is a G1P0 4015w4d 25 y.o. female with gestational diabetes treated with metformin presenting with elevated blood pressures (peak 140/94 2 hours ago) and induction of labor. She had a visit this morning at Dr. Verdell CarmineHarper's office for a regular prenatal visit and was found to have elevated BPs in the office so came here for labs and monitoring.  Maternal Medical History:  Contractions: Frequency: irregular.      OB History    Gravida Para Term Preterm AB TAB SAB Ectopic Multiple Living   1 0 0 0 0 0 0 0 0 0      Past Medical History  Diagnosis Date  . Gestational diabetes    Past Surgical History  Procedure Laterality Date  . Knee surgery     Family History: family history includes Diabetes in her father; Stroke in her father. Social History:  reports that she quit smoking about 7 months ago. She has never used smokeless tobacco. She reports that she does not drink alcohol or use illicit drugs.   Prenatal Transfer Tool  Maternal Diabetes: Yes:  Diabetes Type:  Insulin/Medication controlled (on metformin) Genetic Screening: Normal Maternal Ultrasounds/Referrals: Normal Fetal Ultrasounds or other Referrals:  Other:  performed at 37 weeks for NST and FHR decels, est AFI 13.93 Maternal Substance Abuse:  No Significant Maternal Medications: No current facility-administered medications on file prior to encounter.   Current Outpatient Prescriptions on File Prior to Encounter  Medication Sig Dispense Refill  . hydrocortisone (ANUSOL-HC) 25 MG suppository Place 1 suppository (25 mg total) rectally 2 (two) times daily. 12 suppository 0  . metFORMIN (GLUCOPHAGE) 500 MG tablet Take 1 tablet (500 mg total) by mouth 2 (two) times daily with a meal. 60 tablet 2  . ondansetron (ZOFRAN-ODT) 8 MG disintegrating tablet Take 1 tablet (8 mg total) by mouth every 8 (eight) hours as needed for nausea or vomiting. 20 tablet 0  . Prenatal Vit-Fe Phos-FA-Omega (VITAFOL GUMMIES) 3.33-0.333-34.8 MG CHEW  Chew 3 tablets by mouth daily. 90 tablet 12   Significant Maternal Lab Results:  Lab values include: Other:  GBS -  Glucose 136 H Ur Protein/Creat 0.50 H Platelets 319 K/ul wnl AST & ALT 16 & 15 wnl  Other Comments:  None  Review of Systems  Constitutional: Negative for fever and chills.  Eyes: Negative.   Respiratory: Negative.   Cardiovascular: Negative.   Gastrointestinal: Negative.   Neurological: Negative for dizziness, sensory change and headaches.    Dilation: 4 Effacement (%): 70 Station: -3 Exam by:: Venia CarbonJennifer Rasch, NP Blood pressure 128/62, pulse 87, temperature 98 F (36.7 C), temperature source Oral, resp. rate 18, last menstrual period 06/11/2015. Maternal Exam:  Uterine Assessment: Contraction frequency is irregular.   Abdomen: Patient reports no abdominal tenderness. Pelvis: adequate for delivery.      Physical Exam  Constitutional: She appears well-developed and well-nourished.  HENT:  Head: Normocephalic and atraumatic.  Cardiovascular: Normal rate and regular rhythm.   Respiratory: Effort normal and breath sounds normal.  GI: Soft.  Neurological: She is alert.  Psychiatric: She has a normal mood and affect. Her behavior is normal. Judgment and thought content normal.  MSK: no LE edema   Prenatal labs: ABO, Rh: A/POS/-- (01/10 1700) Antibody: NEG (01/10 1700) Rubella: 0.62 (01/10 1700) RPR: Non Reactive (05/02 0940)  HBsAg: NEGATIVE (01/10 1700)  HIV: Non Reactive (05/02 0940)  GBS: Negative (06/27 1546)   Assessment/Plan: Savannah FriskKram Reichenberger is a G1P0 5215w4d 24 y.o. female with gestational diabetes treated with metformin  presenting with elevated blood pressures (peak 140/94 2 hours ago) and induction of labor.   Pre-eclampsia W/O Severe Features - monitor BP  A2GDM Hold metformin during labor. CBG q4 hours in latent labor and q2 hours in active labor.   Labor:  -Pt dilated to 4 , membranes stripped this morning in clinic, pit ordered -Pt would  prefer to not have invasive pain management -Fetal heart rate baseline is 140-150 -Anticipate vaginal delivery  -Plans on breastfeeding -Has not decided on what contraception following delievery -Baby boy expected, no circ planned  Andres Ege, MD pgy-1, MPH 03/07/2016, 1:21 PM  OB FELLOW HISTORY AND PHYSICAL ATTESTATION  I have seen and examined this patient; I agree with above documentation in the resident's note.    Ernestina Penna 03/07/2016, 4:58 PM

## 2016-03-07 NOTE — Progress Notes (Signed)
[facility]  Patient to be admitted to room 163.

## 2016-03-07 NOTE — Progress Notes (Signed)
Pt states increase and having stronger ctx, pt also having some bloody show when wiping.

## 2016-03-07 NOTE — Anesthesia Pain Management Evaluation Note (Signed)
  [facility] Pain Management Visit Note  Patient: Savannah Marsh, 25 y.o., female  "Hello I am a [facility] of the [facility]. We have an anesthesia team available at all times to provide care throughout the hospital, including epidural management and anesthesia for C-section. I don't know your plan for the delivery whether it a natural birth, water birth, IV sedation, nitrous supplementation, doula or epidural, but we want to meet your pain goals."   1.Was your pain managed to your expectations on prior hospitalizations?   No prior hospitalizations  2.What is your expectation for pain management during this hospitalization?     Undecided but says does not want epidural  3.How can we help you reach that goal? Does not want epidural. May consider nitrous  Record the patient's initial score and the patient's pain goal.   Pain: 7  Pain Goal: 9 [facility] wants you to be able to say your pain was always managed very well.  Adonis Ryther 03/07/2016

## 2016-03-07 NOTE — MAU Provider Note (Signed)
History     CSN: [id number]  Arrival date and time: 03/07/16 1005   First Provider Initiated Contact with Patient 03/07/16 1119      Chief Complaint  Patient presents with  . Hypertension  . Contractions   HPI   Ms.Savannah Marsh is a 25 y.o. female G1P0 at 19w4dwith a history of GDM; on metformin, here for BP monitoring and labs. She was seen in Dr. HJethro Bolusoffice today for her regular prenatal visit and had elevated BP readings in the office.   Her cervix was 3 cm in the office today and her membranes were stripped.   OB History    Gravida Para Term Preterm AB TAB SAB Ectopic Multiple Living   1               Past Medical History  Diagnosis Date  . Gestational diabetes     Past Surgical History  Procedure Laterality Date  . Knee surgery      Family History  Problem Relation Age of Onset  . Stroke Father   . Diabetes Father     Social History  Substance Use Topics  . Smoking status: Former Smoker    Quit date: 07/16/2015  . Smokeless tobacco: Never Used  . Alcohol Use: No    Allergies: No Known Allergies  Prescriptions prior to admission  Medication Sig Dispense Refill Last Dose  . hydrocortisone (ANUSOL-HC) 25 MG suppository Place 1 suppository (25 mg total) rectally 2 (two) times daily. 12 suppository 0 02/25/2016 at Unknown time  . metFORMIN (GLUCOPHAGE) 500 MG tablet Take 1 tablet (500 mg total) by mouth 2 (two) times daily with a meal. 60 tablet 2 02/25/2016 at Unknown time  . ondansetron (ZOFRAN-ODT) 8 MG disintegrating tablet Take 1 tablet (8 mg total) by mouth every 8 (eight) hours as needed for nausea or vomiting. (Patient not taking: Reported on 02/25/2016) 20 tablet 0 Not Taking at Unknown time  . Prenatal Vit-Fe Phos-FA-Omega (VITAFOL GUMMIES) 3.33-0.333-34.8 MG CHEW Chew 3 tablets by mouth daily. 90 tablet 12 Past Month at Unknown time   Results for orders placed or performed during the hospital encounter of 03/07/16 (from the past 48 hour(s))   Protein / creatinine ratio, urine     Status: Abnormal   Collection Time: 03/07/16 10:40 AM  Result Value Ref Range   Creatinine, Urine 167.00 mg/dL   Total Protein, Urine 83 mg/dL    Comment: NO NORMAL RANGE ESTABLISHED FOR THIS TEST   Protein Creatinine Ratio 0.50 (H) 0.00 - 0.15 mg/mg[Cre]  CBC     Status: Abnormal   Collection Time: 03/07/16 11:28 AM  Result Value Ref Range   WBC 12.0 (H) 4.0 - 10.5 K/uL   RBC 5.06 3.87 - 5.11 MIL/uL   Hemoglobin 12.7 12.0 - 15.0 g/dL   HCT 38.3 36.0 - 46.0 %   MCV 75.7 (L) 78.0 - 100.0 fL   MCH 25.1 (L) 26.0 - 34.0 pg   MCHC 33.2 30.0 - 36.0 g/dL   RDW 15.5 11.5 - 15.5 %   Platelets 319 150 - 400 K/uL  Comprehensive metabolic panel     Status: Abnormal   Collection Time: 03/07/16 11:28 AM  Result Value Ref Range   Sodium 134 (L) 135 - 145 mmol/L   Potassium 4.0 3.5 - 5.1 mmol/L   Chloride 103 101 - 111 mmol/L   CO2 21 (L) 22 - 32 mmol/L   Glucose, Bld 136 (H) 65 - 99 mg/dL   BUN  6 6 - 20 mg/dL   Creatinine, Ser 0.61 0.44 - 1.00 mg/dL   Calcium 9.4 8.9 - 10.3 mg/dL   Total Protein 6.8 6.5 - 8.1 g/dL   Albumin 2.9 (L) 3.5 - 5.0 g/dL   AST 16 15 - 41 U/L   ALT 15 14 - 54 U/L   Alkaline Phosphatase 233 (H) 38 - 126 U/L   Total Bilirubin 0.3 0.3 - 1.2 mg/dL   GFR calc non Af Amer >60 >60 mL/min   GFR calc Af Amer >60 >60 mL/min    Comment: (NOTE) The eGFR has been calculated using the CKD EPI equation. This calculation has not been validated in all clinical situations. eGFR's persistently <60 mL/min signify possible Chronic Kidney Disease.    Anion gap 10 5 - 15    Review of Systems  Eyes: Negative for blurred vision.  Gastrointestinal: Negative for nausea, vomiting and abdominal pain.  Neurological: Negative for headaches.   Physical Exam   Blood pressure 128/90, pulse 95, temperature 98 F (36.7 C), temperature source Oral, resp. rate 18, last menstrual period 06/11/2015.   Patient Vitals for the past 24 hrs:  BP Temp  Temp src Pulse Resp  03/07/16 1145 123/76 mmHg - - 91 -  03/07/16 1130 135/81 mmHg - - 93 -  03/07/16 1115 140/94 mmHg - - 96 -  03/07/16 1100 128/90 mmHg - - 95 -  03/07/16 1043 125/85 mmHg - - 113 -  03/07/16 1018 136/94 mmHg 98 F (36.7 C) Oral 109 18   Physical Exam  Constitutional: She is oriented to person, place, and time. She appears well-developed and well-nourished.  HENT:  Head: Normocephalic.  Eyes: Pupils are equal, round, and reactive to light.  Respiratory: Effort normal.  GI: Soft. She exhibits no distension. There is no tenderness. There is no rebound and no guarding.  Genitourinary:  Cervix: 4, 70%, -3, soft. Ballotable. Bloody show noted on exam glove.   Musculoskeletal: Normal range of motion. She exhibits no tenderness.  Negative clonus  Neurological: She is alert and oriented to person, place, and time. She has normal reflexes. She displays normal reflexes.  Skin: Skin is warm.  Psychiatric: Her behavior is normal.    MAU Course  Procedures  None  MDM  PIH labs NST Discussed labs with Dr. Nehemiah Settle.   Assessment and Plan   A:  1. Preeclampsia, third trimester     P:  Admit to labor and delivery GBS negative  Category 1 fetal tracing  Dr. Si Raider made aware.    Lezlie Lye, NP 03/07/2016 11:20 AM

## 2016-03-07 NOTE — Progress Notes (Signed)
Provider stated to notify if minimal varibility and variables persist.

## 2016-03-07 NOTE — MAU Note (Signed)
Pt sent to MAU from [facility] for elevated BP, also C/O contractions since Saturday night, has had some spotting since Sunday night, denies LOF.  Denies HA, visual changes or epigastric pain.  SVE 3 cm's @ office today.

## 2016-03-08 ENCOUNTER — Encounter (HOSPITAL_COMMUNITY): Payer: Self-pay

## 2016-03-08 DIAGNOSIS — Z3A38 38 weeks gestation of pregnancy: Secondary | ICD-10-CM

## 2016-03-08 DIAGNOSIS — O1414 Severe pre-eclampsia complicating childbirth: Secondary | ICD-10-CM

## 2016-03-08 DIAGNOSIS — O24425 Gestational diabetes mellitus in childbirth, controlled by oral hypoglycemic drugs: Secondary | ICD-10-CM

## 2016-03-08 LAB — CBC
HEMATOCRIT: 35.3 % — AB (ref 36.0–46.0)
HEMOGLOBIN: 11.6 g/dL — AB (ref 12.0–15.0)
MCH: 24.6 pg — AB (ref 26.0–34.0)
MCHC: 32.9 g/dL (ref 30.0–36.0)
MCV: 74.8 fL — ABNORMAL LOW (ref 78.0–100.0)
Platelets: 271 10*3/uL (ref 150–400)
RBC: 4.72 MIL/uL (ref 3.87–5.11)
RDW: 15.3 % (ref 11.5–15.5)
WBC: 17.8 10*3/uL — ABNORMAL HIGH (ref 4.0–10.5)

## 2016-03-08 LAB — RPR: RPR Ser Ql: NONREACTIVE

## 2016-03-08 LAB — GLUCOSE, CAPILLARY: GLUCOSE-CAPILLARY: 87 mg/dL (ref 65–99)

## 2016-03-08 MED ORDER — TETANUS-DIPHTH-ACELL PERTUSSIS 5-2.5-18.5 LF-MCG/0.5 IM SUSP
0.5000 mL | Freq: Once | INTRAMUSCULAR | Status: AC
  Administered 2016-03-09: 0.5 mL via INTRAMUSCULAR

## 2016-03-08 MED ORDER — ONDANSETRON HCL 4 MG/2ML IJ SOLN: 4.0000 mg | INTRAMUSCULAR | Status: DC | PRN

## 2016-03-08 MED ORDER — COCONUT OIL OIL: 1.0000 "application " | TOPICAL_OIL | Status: DC | PRN

## 2016-03-08 MED ORDER — WITCH HAZEL-GLYCERIN EX PADS: 1.0000 "application " | MEDICATED_PAD | CUTANEOUS | Status: DC | PRN

## 2016-03-08 MED ORDER — SENNOSIDES-DOCUSATE SODIUM 8.6-50 MG PO TABS
2.0000 | ORAL_TABLET | ORAL | Status: DC
  Administered 2016-03-08: 2 via ORAL
  Filled 2016-03-08: qty 2

## 2016-03-08 MED ORDER — PRENATAL MULTIVITAMIN CH
1.0000 | ORAL_TABLET | Freq: Every day | ORAL | Status: DC
  Administered 2016-03-08: 1 via ORAL
  Filled 2016-03-08: qty 1

## 2016-03-08 MED ORDER — DIPHENHYDRAMINE HCL 25 MG PO CAPS
25.0000 mg | ORAL_CAPSULE | Freq: Four times a day (QID) | ORAL | Status: DC | PRN
Start: 2016-03-08 — End: 2016-03-09

## 2016-03-08 MED ORDER — ACETAMINOPHEN 325 MG PO TABS: 650.0000 mg | ORAL_TABLET | ORAL | Status: DC | PRN

## 2016-03-08 MED ORDER — BENZOCAINE-MENTHOL 20-0.5 % EX AERO
1.0000 "application " | INHALATION_SPRAY | CUTANEOUS | Status: DC | PRN
  Administered 2016-03-08: 1 via TOPICAL
  Filled 2016-03-08: qty 56

## 2016-03-08 MED ORDER — DIBUCAINE 1 % RE OINT: 1.0000 "application " | TOPICAL_OINTMENT | RECTAL | Status: DC | PRN

## 2016-03-08 MED ORDER — SIMETHICONE 80 MG PO CHEW: 80.0000 mg | CHEWABLE_TABLET | ORAL | Status: DC | PRN

## 2016-03-08 MED ORDER — MEASLES, MUMPS & RUBELLA VAC ~~LOC~~ INJ
0.5000 mL | INJECTION | Freq: Once | SUBCUTANEOUS | Status: AC
  Administered 2016-03-09: 0.5 mL via SUBCUTANEOUS
  Filled 2016-03-08 (×2): qty 0.5

## 2016-03-08 MED ORDER — ONDANSETRON HCL 4 MG PO TABS: 4.0000 mg | ORAL_TABLET | ORAL | Status: DC | PRN

## 2016-03-08 MED ORDER — IBUPROFEN 600 MG PO TABS
600.0000 mg | ORAL_TABLET | Freq: Four times a day (QID) | ORAL | Status: DC
  Administered 2016-03-08 – 2016-03-09 (×5): 600 mg via ORAL
  Filled 2016-03-08 (×6): qty 1

## 2016-03-08 MED ORDER — ZOLPIDEM TARTRATE 5 MG PO TABS: 5.0000 mg | ORAL_TABLET | Freq: Every evening | ORAL | Status: DC | PRN

## 2016-03-08 NOTE — Progress Notes (Signed)
Post Partum Day #1 Subjective: no complaints, up ad lib, voiding and tolerating PO.  States breastfeeding is going well.    Objective: Blood pressure 119/73, pulse 83, temperature 98.6 F (37 C), temperature source Oral, resp. rate 18, height 5\' 1"  (1.549 m), weight 210 lb (95.255 kg), last menstrual period 06/11/2015, SpO2 100 %, unknown if currently breastfeeding.  Physical Exam:  General: alert, cooperative and no distress Lochia: appropriate Uterine Fundus: firm Incision: no significant drainage DVT Evaluation: No evidence of DVT seen on physical exam. No cords or calf tenderness.   Recent Labs  03/07/16 1128 03/08/16 0550  HGB 12.7 11.6*  HCT 38.3 35.3*    Assessment/Plan: Plan for discharge tomorrow, Breastfeeding and Lactation consult  Mild preeclampsia: B/P stable this AM GDM   LOS: 1 day   Roe CoombsRachelle A Anniah Glick, CNM 03/08/2016, 8:07 AM

## 2016-03-08 NOTE — Anesthesia Postprocedure Evaluation (Signed)
Anesthesia Post Note  Patient: Savannah Marsh  Procedure(s) Performed: * No procedures listed *  Patient location during evaluation: Mother Baby Anesthesia Type: Epidural Level of consciousness: awake and alert Pain management: pain level controlled Vital Signs Assessment: post-procedure vital signs reviewed and stable Respiratory status: spontaneous breathing Cardiovascular status: stable Postop Assessment: no headache, no backache, epidural receding and patient able to bend at knees Anesthetic complications: no     Last Vitals:  Filed Vitals:   03/08/16 0630 03/08/16 1030  BP: 119/73 124/68  Pulse: 83 105  Temp: 37 C 36.9 C  Resp: 18 18    Last Pain:  Filed Vitals:   03/08/16 1050  PainSc: 0-No pain   Pain Goal: Patients Stated Pain Goal: 0 (03/07/16 1019)               Micaila Ziemba

## 2016-03-08 NOTE — Progress Notes (Signed)
cnm stated to give O2 give another fluid bolus

## 2016-03-09 ENCOUNTER — Ambulatory Visit (HOSPITAL_COMMUNITY): Payer: Medicaid Other

## 2016-03-09 ENCOUNTER — Ambulatory Visit: Payer: Self-pay

## 2016-03-09 DIAGNOSIS — Z87891 Personal history of nicotine dependence: Secondary | ICD-10-CM

## 2016-03-09 DIAGNOSIS — Z3A38 38 weeks gestation of pregnancy: Secondary | ICD-10-CM

## 2016-03-09 DIAGNOSIS — O1404 Mild to moderate pre-eclampsia, complicating childbirth: Secondary | ICD-10-CM

## 2016-03-09 DIAGNOSIS — O24425 Gestational diabetes mellitus in childbirth, controlled by oral hypoglycemic drugs: Secondary | ICD-10-CM

## 2016-03-09 LAB — GLUCOSE, RANDOM: GLUCOSE: 97 mg/dL (ref 65–99)

## 2016-03-09 LAB — GLUCOSE, CAPILLARY: Glucose-Capillary: 100 mg/dL — ABNORMAL HIGH (ref 65–99)

## 2016-03-09 MED ORDER — OXYCODONE-ACETAMINOPHEN 5-325 MG PO TABS: 1.0000 | ORAL_TABLET | ORAL | Status: DC | PRN

## 2016-03-09 MED ORDER — WITCH HAZEL-GLYCERIN EX PADS: 1.0000 "application " | MEDICATED_PAD | CUTANEOUS | Status: DC | PRN

## 2016-03-09 MED ORDER — DIBUCAINE 1 % RE OINT: 1.0000 "application " | TOPICAL_OINTMENT | RECTAL | Status: DC | PRN

## 2016-03-09 MED ORDER — COCONUT OIL OIL: 1.0000 "application " | TOPICAL_OIL | Status: DC | PRN

## 2016-03-09 MED ORDER — IBUPROFEN 600 MG PO TABS: 600.0000 mg | ORAL_TABLET | Freq: Four times a day (QID) | ORAL | Status: DC

## 2016-03-09 MED ORDER — SENNOSIDES-DOCUSATE SODIUM 8.6-50 MG PO TABS: 2.0000 | ORAL_TABLET | Freq: Two times a day (BID) | ORAL | Status: DC

## 2016-03-09 NOTE — Lactation Note (Signed)
This note was copied from a baby's chart. Lactation Consultation Note  Patient Name: Savannah Marsh ZOXWR'UToday's Date: 03/09/2016 Reason for consult: Follow-up assessment;Hyperbilirubinemia Baby 32 hours old. Baby is on phototherapy. FOB holding baby. Mom reports that the baby is latching well at the breast, but her milk is not flowing well. Mom states that she is supplementing the baby with formula. Mom reports that she wants to pump in order to get her milk to come in. So, set mom up with DEBP and mom began pumping. No colostrum flowing initially. Enc mom to pump for 15 minutes.   Enc mom to put baby to breast with cues and at least every 3 hours. Enc parents to supplement baby with EBM/formula according to supplementation guidelines, and then post-pump for 15 minutes followed by hand expression. Enc mom to call for assistance as needed.   Maternal Data Has patient been taught Hand Expression?: Yes Does the patient have breastfeeding experience prior to this delivery?: No  Feeding Feeding Type: Bottle Fed - Formula  LATCH Score/Interventions                      Lactation Tools Discussed/Used Pump Review: Setup, frequency, and cleaning;Milk Storage Initiated by:: JW Date initiated:: 03/09/16   Consult Status Consult Status: Follow-up Date: 03/10/16 Follow-up type: In-patient    [name], Allena Pietila 03/09/2016, 12:03 PM

## 2016-03-09 NOTE — Discharge Instructions (Signed)
Hypertension During Pregnancy °Hypertension is also called high blood pressure. Blood pressure moves blood in your body. Sometimes, the force that moves the blood becomes too strong. When you are pregnant, this condition should be watched carefully. It can cause problems for you and your baby. °HOME CARE  °· Make and keep all of your doctor visits. °· Take medicine as told by your doctor. Tell your doctor about all medicines you take. °· Eat very little salt. °· Exercise regularly. °· Do not drink alcohol. °· Do not smoke. °· Do not have drinks with caffeine. °· Lie on your left side when resting. °· Your health care provider may ask you to take one low-dose aspirin (81mg) each day. °GET HELP RIGHT AWAY IF: °· You have bad belly (abdominal) pain. °· You have sudden puffiness (swelling) in the hands, ankles, or face. °· You gain 4 pounds (1.8 kilograms) or more in 1 week. °· You throw up (vomit) repeatedly. °· You have bleeding from the vagina. °· You do not feel the baby moving as much. °· You have a headache. °· You have blurred or double vision. °· You have muscle twitching or spasms. °· You have shortness of breath. °· You have blue fingernails and lips. °· You have blood in your pee (urine). °MAKE SURE YOU: °· Understand these instructions. °· Will watch your condition. °· Will get help right away if you are not doing well or get worse. °  °This information is not intended to replace advice given to you by your health care provider. Make sure you discuss any questions you have with your health care provider. °  °Document Released: 09/09/2010 Document Revised: 08/28/2014 Document Reviewed: 03/06/2013 °Elsevier Interactive Patient Education ©2016 Elsevier Inc. ° °

## 2016-03-09 NOTE — Lactation Note (Signed)
This note was copied from a baby's chart. Lactation Consultation Note  Patient Name: Savannah Marsh AVWUJ'WToday's Date: 03/09/2016  Initial visit done yesterday afternoon.  Breastfeeding consultation services and support information given to patient.  Baby has had all formula bottles.  Mom states she is going to try to breastfeed.  Instructed on feeding cues and to call for latch assist.   Maternal Data    Feeding Feeding Type: Bottle Fed - Formula  LATCH Score/Interventions                      Lactation Tools Discussed/Used     Consult Status      Huston FoleyMOULDEN, Camala Talwar S 03/09/2016, 11:41 AM

## 2016-03-09 NOTE — Discharge Summary (Signed)
Obstetric Discharge Summary Reason for Admission: induction of labor and for mild preeclampsia, GDM Prenatal Procedures: NST, Preeclampsia and ultrasound Intrapartum Procedures: spontaneous vaginal delivery Postpartum Procedures: none Complications-Operative and Postpartum: 1st degree perineal laceration HEMOGLOBIN  Date Value Ref Range Status  03/08/2016 11.6* 12.0 - 15.0 g/dL Final   HCT  Date Value Ref Range Status  03/08/2016 35.3* 36.0 - 46.0 % Final   HEMATOCRIT  Date Value Ref Range Status  12/21/2015 36.9 34.0 - 46.6 % Final    Physical Exam:  General: alert, cooperative and no distress Lochia: appropriate Uterine Fundus: firm Incision: no significant drainage DVT Evaluation: No evidence of DVT seen on physical exam. Negative Homan's sign. No cords or calf tenderness. No significant calf/ankle edema.  Discharge Diagnoses: Term Pregnancy-delivered, Preelampsia and GDM  Discharge Information: Date: 03/09/2016 Activity: pelvic rest Diet: routine Medications: PNV, Ibuprofen, Colace, Iron and Percocet Condition: stable Instructions: refer to practice specific booklet Discharge to: home Follow-up Information    [facility] Schedule an appointment as soon as possible for a visit in 2 weeks.   Why:  postpartum exam   Contact information:   [address] [place] [phone]      Newborn Data: Live born female  Birth Weight: 6 lb 12.1 oz (3065 g) APGAR: 8, 9  Home with mother.  Roe Coombsachelle A Zalaya Astarita, CNM 03/09/2016, 8:34 AM

## 2016-03-09 NOTE — Progress Notes (Signed)
Post Partum Day #1 Subjective: no complaints, up ad lib, voiding, tolerating PO and + flatus. Desires to go home today.   Objective: Blood pressure 133/75, pulse 78, temperature 98.3 F (36.8 C), temperature source Oral, resp. rate 18, height 5\' 1"  (1.549 m), weight 210 lb (95.255 kg), last menstrual period 06/11/2015, SpO2 100 %, unknown if currently breastfeeding.  Physical Exam:  General: alert, cooperative and no distress Lochia: appropriate Uterine Fundus: firm Incision: no significant drainage DVT Evaluation: No evidence of DVT seen on physical exam. Negative Homan's sign. No cords or calf tenderness. No significant calf/ankle edema.   Recent Labs  03/07/16 1128 03/08/16 0550  HGB 12.7 11.6*  HCT 38.3 35.3*    Assessment/Plan: Discharge home and Breastfeeding   LOS: 2 days   Savannah Marsh, CNM 03/09/2016, 8:32 AM

## 2016-03-10 ENCOUNTER — Inpatient Hospital Stay (HOSPITAL_COMMUNITY): Admission: RE | Admit: 2016-03-10 | Payer: Medicaid Other | Source: Ambulatory Visit

## 2016-03-10 ENCOUNTER — Ambulatory Visit (HOSPITAL_COMMUNITY): Payer: Medicaid Other

## 2016-03-10 ENCOUNTER — Encounter (HOSPITAL_COMMUNITY): Payer: Self-pay

## 2016-03-10 ENCOUNTER — Ambulatory Visit: Payer: Self-pay

## 2016-03-10 NOTE — Lactation Note (Signed)
This note was copied from a baby's chart. Lactation Consultation Note  Patient Name: Savannah Marsh [id number]'XToday's Date: 03/10/2016 Reason for consult: Follow-up assessment;Hyperbilirubinemia  Baby 58 hours old. Mom sleeping when LC entered the room, but [facility]. Mom reports that pumping is going well and she is planning on getting her own DEBP. Mom states that she plans to give formula until she gets her own milk to flow. Discussed supply and demand, placing baby to breast with each feeding, and pumping after each feeding. Mom declines any assistance at this time. Referred mom to Baby and Me booklet for EBM storage guidelines and number of diapers to expect by day of life. Mom aware of OP/BFSG and LC phone line assistance after D/C.  Maternal Data    Feeding    LATCH Score/Interventions                      Lactation Tools Discussed/Used     Consult Status Consult Status: PRN    [name], Caleigh Rabelo 03/10/2016, 1:25 PM

## 2016-03-14 ENCOUNTER — Encounter: Payer: Medicaid Other | Admitting: Certified Nurse Midwife

## 2016-03-22 ENCOUNTER — Ambulatory Visit: Payer: Medicaid Other | Admitting: Certified Nurse Midwife

## 2016-03-23 ENCOUNTER — Ambulatory Visit (INDEPENDENT_AMBULATORY_CARE_PROVIDER_SITE_OTHER): Payer: Medicaid Other | Admitting: Obstetrics

## 2016-03-23 DIAGNOSIS — Z3009 Encounter for other general counseling and advice on contraception: Secondary | ICD-10-CM

## 2016-03-27 ENCOUNTER — Encounter: Payer: Self-pay | Admitting: Obstetrics

## 2016-03-27 NOTE — Progress Notes (Signed)
Subjective:     Savannah Marsh is a 25 y.o. female who presents for a postpartum visit. She is 2 weeks postpartum following a spontaneous vaginal delivery. I have fully reviewed the prenatal and intrapartum course. The delivery was at 38 gestational weeks. Outcome: spontaneous vaginal delivery. Anesthesia: epidural. Postpartum course has been normal. Baby's course has been normal. Baby is feeding by breast. Bleeding thin lochia. Bowel function is normal. Bladder function is normal. Patient is not sexually active. Contraception method is abstinence. Postpartum depression screening: negative.  Tobacco, alcohol and substance abuse history reviewed.  Adult immunizations reviewed including TDAP, rubella and varicella.  The following portions of the patient's history were reviewed and updated as appropriate: allergies, current medications, past family history, past medical history, past social history, past surgical history and problem list.  Review of Systems A comprehensive review of systems was negative.   Objective:    BP 129/88   Pulse (!) 102   Temp 98.7 F (37.1 C) (Oral)   Wt 194 lb (88 kg)   LMP 06/11/2015   BMI 36.66 kg/m      Assessment:    2 weeks postpartum.  Doing well.  Contraceptive counseling and advice  Plan:    1. Contraception: Considering options 2. Continue PNV's 3. Follow up in: 4 weeks or as needed.   Healthy lifestyle practices reviewed

## 2016-04-20 ENCOUNTER — Ambulatory Visit: Payer: Medicaid Other

## 2016-04-20 ENCOUNTER — Encounter: Payer: Self-pay | Admitting: *Deleted

## 2016-04-20 ENCOUNTER — Ambulatory Visit: Payer: Medicaid Other | Admitting: Certified Nurse Midwife

## 2016-04-20 ENCOUNTER — Ambulatory Visit (INDEPENDENT_AMBULATORY_CARE_PROVIDER_SITE_OTHER): Payer: Medicaid Other | Admitting: Family

## 2016-04-20 DIAGNOSIS — M549 Dorsalgia, unspecified: Secondary | ICD-10-CM | POA: Diagnosis not present

## 2016-04-20 DIAGNOSIS — R87621 Atypical squamous cells cannot exclude high grade squamous intraepithelial lesion on cytologic smear of vagina (ASC-H): Secondary | ICD-10-CM | POA: Diagnosis not present

## 2016-04-20 MED ORDER — CYCLOBENZAPRINE HCL 10 MG PO TABS: 10.0000 mg | ORAL_TABLET | Freq: Three times a day (TID) | ORAL | 2 refills | Status: DC | PRN

## 2016-04-20 NOTE — Patient Instructions (Addendum)
  Place postpartum visit patient instructions here.  Colposcopy Colposcopy is a procedure to examine your cervix and vagina, or the area around the outside of your vagina, for abnormalities or signs of disease. The procedure is done using a lighted microscope called a colposcope. Tissue samples may be collected during the colposcopy if your health care provider finds any unusual cells. A colposcopy may be done if a woman has:  An abnormal Pap test. A Pap test is a medical test done to evaluate cells that are on the surface of the cervix.  A Pap test result that is suggestive of human papillomavirus (HPV). This virus can cause genital warts and is linked to the development of cervical cancer.  A sore on her cervix and the results of a Pap test were normal.  Genital warts on the cervix or in or around the outside of the vagina.  A mother who took the drug diethylstilbestrol (DES) while pregnant.  Painful intercourse.  Vaginal bleeding, especially after sexual intercourse. LET [facility] KNOW ABOUT:  Any allergies you have.  All medicines you are taking, including vitamins, herbs, eye drops, creams, and over-the-counter medicines.  Previous problems you or members of your family have had with the use of anesthetics.  Any blood disorders you have.  Previous surgeries you have had.  Medical conditions you have. RISKS AND COMPLICATIONS Generally, a colposcopy is a safe procedure. However, as with any procedure, complications can occur. Possible complications include:  Bleeding.  Infection.  Missed lesions. BEFORE THE PROCEDURE   Tell your health care provider if you have your menstrual period. A colposcopy typically is not done during menstruation.  For 24 hours before the colposcopy, do not:  Douche.  Use tampons.  Use medicines, creams, or suppositories in the vagina.  Have sexual intercourse. PROCEDURE  During the procedure, you will be lying on your  back with your feet in foot rests (stirrups). A warm metal or plastic instrument (speculum) will be placed in your vagina to keep it open and to allow the health care provider to see the cervix. The colposcope will be placed outside the vagina. It will be used to magnify and examine the cervix, vagina, and the area around the outside of the vagina. A small amount of liquid solution will be placed on the area that is to be viewed. This solution will make it easier to see the abnormal cells. Your health care provider will use tools to suck out mucus and cells from the canal of the cervix. Then he or she will record the location of the abnormal areas. If a biopsy is done during the procedure, a medicine will usually be given to numb the area (local anesthetic). You may feel mild pain or cramping while the biopsy is done. After the procedure, tissue samples collected during the biopsy will be sent to a lab for analysis. AFTER THE PROCEDURE  You will be given instructions on when to follow up with your health care provider for your test results. It is important to keep your appointment.   This information is not intended to replace advice given to you by your health care provider. Make sure you discuss any questions you have with your health care provider.   Document Released: 10/28/2002 Document Revised: 04/09/2013 Document Reviewed: 03/06/2013 Elsevier Interactive Patient Education [employer]2016 Elsevier Inc.  The current method of family planning is coitus interruptus.

## 2016-04-20 NOTE — Progress Notes (Signed)
Subjective:     Savannah Marsh is a 25 y.o. female who presents for a postpartum visit. She is 6 weeks postpartum following a spontaneous vaginal delivery. I have fully reviewed the prenatal and intrapartum course. The delivery was at 38 gestational weeks. Outcome: spontaneous vaginal delivery. Anesthesia: epidural. Postpartum course has been unremakable. Baby's course has been unremakable. Baby is feeding by bottle - Similac Sensitive RS. Bleeding no bleeding. Lasted 3 weeks.  Bowel function is normal. Bladder function is normal. Patient is sexually active. Contraception method is coitus interruptus. Postpartum depression screening: positive.  The following portions of the patient's history were reviewed and updated as appropriate: allergies, current medications, past family history, past medical history, past social history, past surgical history and problem list.  Review of Systems Pertinent items are noted in HPI.   Objective:    BP 119/88   Pulse 87   Temp 98.3 F (36.8 C) (Oral)   Wt 197 lb 4.8 oz (89.5 kg)   BMI 37.28 kg/m         General:  alert, cooperative and appears stated age   Breasts:  inspection negative, no nipple discharge or bleeding, no masses or nodularity palpable  Lungs: clear to auscultation bilaterally  Heart:  regular rate and rhythm, S1, S2 normal, no murmur, click, rub or gallop  Abdomen: soft, non-tender; bowel sounds normal; no masses,  no organomegaly; uterus involuted   Vulva:  normal  Vagina: normal vagina, no discharge, exudate, lesion, or erythema; healed well  Cervix:  no cervical motion tenderness  Corpus: normal size, contour, position, consistency, mobility, non-tender  Adnexa:  normal adnexa  Rectal Exam: Not performed.   Assessment:     Normal postpartum exam. Pap smear not done at today's visit.   Abnormal Pap Smear - [facility] (January 17) Hx of Gestational Diabetes Back Pain  Plan:    1. Contraception: coitus interruptus; understands  increased risk of pregnancy; reviewed various methods of family planning.  May decide on a method when return for colpo and 2 hr. 2. RX Flexeril for back pain 3. Follow up in: 4 weeks or as needed.    Eino FarberWalidah Kennith GainN Karim, CNM

## 2016-05-18 ENCOUNTER — Other Ambulatory Visit: Payer: Medicaid Other

## 2016-05-18 ENCOUNTER — Encounter: Payer: Self-pay | Admitting: Obstetrics

## 2016-05-18 ENCOUNTER — Ambulatory Visit (INDEPENDENT_AMBULATORY_CARE_PROVIDER_SITE_OTHER): Payer: Medicaid Other | Admitting: Obstetrics

## 2016-05-18 ENCOUNTER — Other Ambulatory Visit: Payer: Self-pay | Admitting: Obstetrics

## 2016-05-18 VITALS — BP 120/84 | HR 70 | Temp 97.6°F | Wt 200.0 lb

## 2016-05-18 DIAGNOSIS — Z3202 Encounter for pregnancy test, result negative: Secondary | ICD-10-CM | POA: Diagnosis not present

## 2016-05-18 DIAGNOSIS — R87611 Atypical squamous cells cannot exclude high grade squamous intraepithelial lesion on cytologic smear of cervix (ASC-H): Secondary | ICD-10-CM

## 2016-05-18 DIAGNOSIS — Z8632 Personal history of gestational diabetes: Secondary | ICD-10-CM

## 2016-05-18 DIAGNOSIS — Z01818 Encounter for other preprocedural examination: Secondary | ICD-10-CM

## 2016-05-18 LAB — POCT URINE PREGNANCY: Preg Test, Ur: NEGATIVE

## 2016-05-18 NOTE — Progress Notes (Signed)
Colposcopy Procedure Note  Indications: Pap smear 8 months ago showed: ASC cannot exclude high grade lesion [facility]). The prior pap showed no abnormalities.  Prior cervical/vaginal disease: normal exam without visible pathology. Prior cervical treatment: no treatment.  Procedure Details  The risks and benefits of the procedure and Written informed consent obtained.  A time-out was performed confirming the patient, procedure and allergy status  Speculum placed in vagina and excellent visualization of cervix achieved, cervix swabbed x 3 with acetic acid solution.  Findings: Cervix: no visible lesions, no mosaicism, no punctation and no abnormal vasculature; SCJ visualized 360 degrees without lesions, endocervical curettage performed, cervical biopsies taken at 6 and 12 o'clock, specimen labelled and sent to pathology and hemostasis achieved with silver nitrate.   Vaginal inspection: normal without visible lesions. Vulvar colposcopy: vulvar colposcopy not performed.   Physical Exam   Specimens: ECC and cervical biopsies  Complications: none.  Plan: Specimens labelled and sent to Pathology. Will base further treatment on Pathology findings. Post biopsy instructions given to patient. Return to discuss Pathology results in 2 weeks.

## 2016-05-19 LAB — GLUCOSE TOLERANCE, 2 HOURS W/ 1HR
GLUCOSE, 1 HOUR: 176 mg/dL (ref 65–179)
GLUCOSE, 2 HOUR: 115 mg/dL (ref 65–152)
Glucose, Fasting: 90 mg/dL (ref 65–91)

## 2016-06-06 ENCOUNTER — Ambulatory Visit: Payer: Self-pay | Admitting: Certified Nurse Midwife

## 2016-06-14 ENCOUNTER — Telehealth: Payer: Self-pay | Admitting: *Deleted

## 2016-06-14 NOTE — Telephone Encounter (Signed)
Pt made aware that her pap smear may be repeated in one year. Pt would like to cancel upcoming appt to review colpo results. appt will be canceled.

## 2016-06-26 ENCOUNTER — Ambulatory Visit: Payer: Medicaid Other | Admitting: Certified Nurse Midwife

## 2017-04-30 ENCOUNTER — Ambulatory Visit (HOSPITAL_COMMUNITY)
Admission: EM | Admit: 2017-04-30 | Discharge: 2017-04-30 | Disposition: A | Discharge status: Home / Self Care | Payer: Medicaid Other | Attending: Family Medicine | Admitting: Family Medicine

## 2017-04-30 ENCOUNTER — Encounter (HOSPITAL_COMMUNITY): Payer: Self-pay | Admitting: *Deleted

## 2017-04-30 DIAGNOSIS — R200 Anesthesia of skin: Secondary | ICD-10-CM

## 2017-04-30 MED ORDER — DICLOFENAC SODIUM 75 MG PO TBEC: 75.0000 mg | DELAYED_RELEASE_TABLET | Freq: Two times a day (BID) | ORAL | 0 refills | Status: DC

## 2017-04-30 NOTE — Discharge Instructions (Signed)
Please follow up in one week if not improving.

## 2017-04-30 NOTE — ED Triage Notes (Signed)
Pt  Reports   Some  Numbness  To  r  Big  Toe     For   About  1   Week  He  States  Some  Numbness  That   Developed      sev  Days  Ago  She  Denies   Any injury        Some   Toenail   Discoloration  Noted

## 2017-05-02 NOTE — ED Provider Notes (Signed)
  [facility]   [id number] 04/30/17 Arrival Time: 1445  ASSESSMENT & PLAN:  1. Numbness of toes     Meds ordered this encounter  Medications  . diclofenac (VOLTAREN) 75 MG EC tablet    Sig: Take 1 tablet (75 mg total) by mouth 2 (two) times daily.    Dispense:  14 tablet    Refill:  0   I am unsure as to what exactly is causing her to have transient numbness of her R great toe. Trial of Voltaren for one week. Will f/u if worsening or not improving. Reviewed expectations re: course of current medical issues. Questions answered. Outlined signs and symptoms indicating need for more acute intervention. Patient verbalized understanding. After Visit Summary given.   SUBJECTIVE:  Savannah Marsh is a 26 y.o. female who presents with complaint of R great toe "numbness". Transient for the past week. No injury or trauma. No h/o similar. Can last from minutes to hours. Gradual onset. No specific aggravating or alleviating factors reported. No skin changes. Ambulatory without difficulty. No self treatment.  ROS: As per HPI.   OBJECTIVE:  Vitals:   04/30/17 1509  BP: 134/74  Pulse: 82  Resp: 18  Temp: 98.6 F (37 C)  TempSrc: Oral  SpO2: 100%    General appearance: alert; no distress Extremities: R great toe and foot with normal appearance; no swelling; no skin changes; no signs of trauma; she reports decreased sensation over distal R great toe; FROM; normal capillary refill Skin: warm and dry Neurologic: normal gait; normal symmetric reflexes in lower extremities Psychological: alert and cooperative; normal mood and affect  No Known Allergies  Past Medical History:  Diagnosis Date  . Gestational diabetes    Social History   Social History  . Marital status: Single    Spouse name: N/A  . Number of children: N/A  . Years of education: N/A   Occupational History  . Not on file.   Social History Main Topics  . Smoking status: Former Smoker    Quit date:  07/16/2015  . Smokeless tobacco: Never Used  . Alcohol use Yes  . Drug use: No  . Sexual activity: Yes    Birth control/ protection: None     Comment: last had intercourse 2 months ago   Other Topics Concern  . Not on file   Social History Narrative  . No narrative on file   Family History  Problem Relation Age of Onset  . Stroke Father   . Diabetes Father    Past Surgical History:  Procedure Laterality Date  . KNEE SURGERY       Mardella LaymanHagler, Icelyn Navarrete, MD 05/02/17 [phone]

## 2017-05-22 ENCOUNTER — Ambulatory Visit (HOSPITAL_COMMUNITY)
Admission: EM | Admit: 2017-05-22 | Discharge: 2017-05-22 | Disposition: A | Discharge status: Home / Self Care | Payer: Medicaid Other | Attending: Family Medicine | Admitting: Family Medicine

## 2017-05-22 ENCOUNTER — Encounter (HOSPITAL_COMMUNITY): Payer: Self-pay

## 2017-05-22 ENCOUNTER — Encounter (HOSPITAL_COMMUNITY): Payer: Self-pay | Admitting: Emergency Medicine

## 2017-05-22 ENCOUNTER — Other Ambulatory Visit: Payer: Self-pay

## 2017-05-22 ENCOUNTER — Ambulatory Visit: Payer: Medicaid Other | Admitting: Certified Nurse Midwife

## 2017-05-22 DIAGNOSIS — R42 Dizziness and giddiness: Secondary | ICD-10-CM | POA: Diagnosis not present

## 2017-05-22 DIAGNOSIS — Z87891 Personal history of nicotine dependence: Secondary | ICD-10-CM | POA: Diagnosis not present

## 2017-05-22 DIAGNOSIS — R05 Cough: Secondary | ICD-10-CM | POA: Diagnosis present

## 2017-05-22 DIAGNOSIS — J069 Acute upper respiratory infection, unspecified: Secondary | ICD-10-CM | POA: Diagnosis not present

## 2017-05-22 DIAGNOSIS — B9789 Other viral agents as the cause of diseases classified elsewhere: Secondary | ICD-10-CM | POA: Diagnosis not present

## 2017-05-22 DIAGNOSIS — J029 Acute pharyngitis, unspecified: Secondary | ICD-10-CM | POA: Insufficient documentation

## 2017-05-22 LAB — URINALYSIS, ROUTINE W REFLEX MICROSCOPIC
BILIRUBIN URINE: NEGATIVE
GLUCOSE, UA: NEGATIVE mg/dL
HGB URINE DIPSTICK: NEGATIVE
KETONES UR: 5 mg/dL — AB
Nitrite: NEGATIVE
PH: 6 (ref 5.0–8.0)
Protein, ur: 100 mg/dL — AB
SPECIFIC GRAVITY, URINE: 1.02 (ref 1.005–1.030)

## 2017-05-22 LAB — BASIC METABOLIC PANEL
ANION GAP: 7 (ref 5–15)
BUN: 6 mg/dL (ref 6–20)
CALCIUM: 8.9 mg/dL (ref 8.9–10.3)
CHLORIDE: 99 mmol/L — AB (ref 101–111)
CO2: 25 mmol/L (ref 22–32)
CREATININE: 0.73 mg/dL (ref 0.44–1.00)
GFR calc non Af Amer: >60 mL/min (ref >60)
Glucose, Bld: 121 mg/dL — ABNORMAL HIGH (ref 65–99)
Potassium: 3.5 mmol/L (ref 3.5–5.1)
SODIUM: 131 mmol/L — AB (ref 135–145)

## 2017-05-22 LAB — CBC
HCT: 44.5 % (ref 36.0–46.0)
HEMOGLOBIN: 14.3 g/dL (ref 12.0–15.0)
MCH: 26.1 pg (ref 26.0–34.0)
MCHC: 32.1 g/dL (ref 30.0–36.0)
MCV: 81.4 fL (ref 78.0–100.0)
PLATELETS: 317 10*3/uL (ref 150–400)
RBC: 5.47 MIL/uL — AB (ref 3.87–5.11)
RDW: 13.6 % (ref 11.5–15.5)
WBC: 10 10*3/uL (ref 4.0–10.5)

## 2017-05-22 LAB — I-STAT BETA HCG BLOOD, ED (MC, WL, AP ONLY): I-stat hCG, quantitative: <5 m[IU]/mL (ref <5)

## 2017-05-22 LAB — POCT RAPID STREP A: Streptococcus, Group A Screen (Direct): NEGATIVE

## 2017-05-22 MED ORDER — HYDROCODONE-HOMATROPINE 5-1.5 MG/5ML PO SYRP: 5.0000 mL | ORAL_SOLUTION | Freq: Four times a day (QID) | ORAL | 0 refills | Status: DC | PRN

## 2017-05-22 NOTE — ED Triage Notes (Signed)
Pt c/o cold sx onset: yest  Sx include: dry cough, ST, chills, hot flashes, fatigue  Denies: fevers  A&O x4... NAD

## 2017-05-22 NOTE — ED Triage Notes (Signed)
Pt seen at u/c this morning for sore throat, cough, chills, sweats, fatigue.  Negative strep.  Was prescribed cough med.  Onset 12 noon today pt starting having dizziness, feels weak.  Last dose of cough med 7pm.

## 2017-05-22 NOTE — Discharge Instructions (Signed)
Rapid strep test is negative. At this point ear infection seems like it viral and should respond to just supportive care such as ibuprofen and cough medicine, which I have written for you. If you're not improving, please follow up with your primary care doctor or the clinic noted below

## 2017-05-22 NOTE — ED Provider Notes (Signed)
  [facility]   [id number] 05/22/17 Arrival Time: 1120   SUBJECTIVE:  Savannah Marsh is a 26 y.o. female who presents to the urgent care with complaint of cough, sore throat, chills, and sweats which began last night. Patient is a smoker but does not have a history of asthma. She denies shortness of breath or fever.  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 She is not breast-feeding.     Past Medical History:  Diagnosis Date  . Gestational diabetes    Family History  Problem Relation Age of Onset  . Stroke Father   . Diabetes Father    Social History   Social History  . Marital status: Single    Spouse name: N/A  . Number of children: N/A  . Years of education: N/A   Occupational History  . Not on file.   Social History Main Topics  . Smoking status: Former Smoker    Quit date: 07/16/2015  . Smokeless tobacco: Never Used  . Alcohol use Yes  . Drug use: No  . Sexual activity: Yes    Birth control/ protection: None     Comment: last had intercourse 2 months ago   Other Topics Concern  . Not on file   Social History Narrative  . No narrative on file   No outpatient prescriptions have been marked as taking for the 05/22/17 encounter [facility] Encounter).   No Known Allergies    ROS: As per HPI, remainder of ROS negative.   OBJECTIVE:   Vitals:   05/22/17 1136  BP: 122/77  Pulse: 78  Resp: 20  Temp: 98.2 F (36.8 C)  TempSrc: Oral  SpO2: 98%     General appearance: alert; no distress Eyes: PERRL; EOMI; conjunctiva normal HENT: normocephalic; atraumatic; TMs normal, canal normal, external ears normal without trauma; nasal mucosa normal; oral mucosa moderately red posterior pharynx Neck: supple Lungs: clear to auscultation bilaterally Heart: regular rate and rhythm Back: no CVA tenderness Extremities: no cyanosis or edema; symmetrical with no gross deformities Skin: warm and dry Neurologic: normal gait; grossly  normal Psychological: alert and cooperative; normal mood and affect      Labs:  Results for orders placed or performed in visit on 05/18/16  Glucose Tolerance, 2 Hours w/1 Hour  Result Value Ref Range   Glucose, Fasting 90 65 - 91 mg/dL   Glucose, 1 hour 696 65 - 179 mg/dL   Glucose, 2 hour 295 65 - 152 mg/dL  POCT urine pregnancy  Result Value Ref Range   Preg Test, Ur Negative Negative     ASSESSMENT & PLAN:  1. Viral URI with cough     Meds ordered this encounter  Medications  . HYDROcodone-homatropine (HYDROMET) 5-1.5 MG/5ML syrup    Sig: Take 5 mLs by mouth every 6 (six) hours as needed for cough.    Dispense:  60 mL    Refill:  0    Reviewed expectations re: course of current medical issues. Questions answered. Outlined signs and symptoms indicating need for more acute intervention. Patient verbalized understanding. After Visit Summary given.    Procedures:      Elvina Sidle, MD 05/22/17 1201

## 2017-05-23 ENCOUNTER — Emergency Department (HOSPITAL_COMMUNITY)
Admission: EM | Admit: 2017-05-23 | Discharge: 2017-05-23 | Disposition: A | Discharge status: Home / Self Care | Payer: Medicaid Other | Attending: Emergency Medicine | Admitting: Emergency Medicine

## 2017-05-23 ENCOUNTER — Emergency Department (HOSPITAL_COMMUNITY): Payer: Medicaid Other

## 2017-05-23 DIAGNOSIS — J069 Acute upper respiratory infection, unspecified: Secondary | ICD-10-CM

## 2017-05-23 DIAGNOSIS — B9789 Other viral agents as the cause of diseases classified elsewhere: Secondary | ICD-10-CM

## 2017-05-23 MED ORDER — BENZONATATE 100 MG PO CAPS
100.0000 mg | ORAL_CAPSULE | Freq: Once | ORAL | Status: AC
  Administered 2017-05-23: 100 mg via ORAL
  Filled 2017-05-23: qty 1

## 2017-05-23 MED ORDER — IBUPROFEN 400 MG PO TABS
400.0000 mg | ORAL_TABLET | Freq: Once | ORAL | Status: AC
  Administered 2017-05-23: 400 mg via ORAL
  Filled 2017-05-23: qty 1

## 2017-05-23 MED ORDER — BENZONATATE 100 MG PO CAPS: 100.0000 mg | ORAL_CAPSULE | Freq: Three times a day (TID) | ORAL | 0 refills | Status: DC

## 2017-05-23 MED ORDER — SODIUM CHLORIDE 0.9 % IV BOLUS (SEPSIS)
1000.0000 mL | Freq: Once | INTRAVENOUS | Status: AC
  Administered 2017-05-23: 1000 mL via INTRAVENOUS

## 2017-05-23 MED ORDER — IBUPROFEN 600 MG PO TABS: 600.0000 mg | ORAL_TABLET | Freq: Four times a day (QID) | ORAL | 0 refills | Status: DC | PRN

## 2017-05-23 NOTE — ED Provider Notes (Signed)
[facility]   CSN: [id number] Arrival date & time: 05/22/17  2239     History   Chief Complaint Chief Complaint  Patient presents with  . Dizziness    HPI Savannah Marsh is a 26 y.o. female.  HPI  This is a 26 year old female who presents with a 2 day history of cough, sore throat, upper respiratory symptoms, chills. She was seen and evaluated at urgent care yesterday. She had a negative strep screen at that time. She was given hydrocodone cough syrup. Patient reports decreased by mouth intake. Pain with swallowing. No fevers at home.  She reports increasing dizziness throughout the day. Worse with standing. No chest pain, abdominal pain, diarrhea. Does report nausea and vomiting.  Past Medical History:  Diagnosis Date  . Gestational diabetes     Patient Active Problem List   Diagnosis Date Noted  . Pap smear of vagina with ASC-H 04/20/2016  . History of gestational diabetes 03/07/2016  . Hypertension in pregnancy, preeclampsia, delivered 03/07/2016    Past Surgical History:  Procedure Laterality Date  . KNEE SURGERY      OB History    Gravida Para Term Preterm AB Living   0 0 1   SAB TAB Ectopic Multiple Live Births   0 0 0 0 1       Home Medications    Prior to Admission medications   Medication Sig Start Date End Date Taking? Authorizing Provider  HYDROcodone-homatropine (HYDROMET) 5-1.5 MG/5ML syrup Take 5 mLs by mouth every 6 (six) hours as needed for cough. 05/22/17  Yes Elvina Sidle, MD  benzonatate (TESSALON) 100 MG capsule Take 1 capsule (100 mg total) by mouth every 8 (eight) hours. 05/23/17   Lennyn Bellanca, Mayer Masker, MD  ibuprofen (ADVIL,MOTRIN) 600 MG tablet Take 1 tablet (600 mg total) by mouth every 6 (six) hours as needed. 05/23/17   Verlisa Vara, Mayer Masker, MD    Family History Family History  Problem Relation Age of Onset  . Stroke Father   . Diabetes Father     Social History Social History  Substance Use Topics  .  Smoking status: Former Smoker    Quit date: 07/16/2015  . Smokeless tobacco: Never Used  . Alcohol use Yes     Allergies   Patient has no known allergies.   Review of Systems Review of Systems  Constitutional: Positive for chills. Negative for fever.  HENT: Positive for congestion and sore throat.   Respiratory: Positive for cough and shortness of breath.   Cardiovascular: Negative for chest pain.  Gastrointestinal: Positive for nausea and vomiting. Negative for abdominal pain and diarrhea.  Genitourinary: Negative for dysuria.  Neurological: Negative for headaches.  All other systems reviewed and are negative.    Physical Exam Updated Vital Signs BP 109/62   Pulse (!) 53   Temp (!) 97.4 F (36.3 C) (Oral)   Resp 14   LMP 05/15/2017   SpO2 96%   Physical Exam  Constitutional: She is oriented to person, place, and time. She appears well-developed and well-nourished.  obese  HENT:  Head: Normocephalic and atraumatic.  Mild posterior oropharyngeal erythema, symmetric bilateral tonsillar swelling, mild, uvula midline, no trismus  Eyes: Pupils are equal, round, and reactive to light.  Neck: Neck supple.  Cardiovascular: Normal rate, regular rhythm and normal heart sounds.   No murmur heard. Pulmonary/Chest: Effort normal and breath sounds normal. No respiratory distress. She has no wheezes.  Abdominal: Soft. There is no tenderness.  Lymphadenopathy:    She has cervical adenopathy.  Neurological: She is alert and oriented to person, place, and time.  Skin: Skin is warm and dry. No rash noted.  Psychiatric: She has a normal mood and affect.  Nursing note and vitals reviewed.    ED Treatments / Results  Labs (all labs ordered are listed, but only abnormal results are displayed) Labs Reviewed  BASIC METABOLIC PANEL - Abnormal; Notable for the following:       Result Value   Sodium 131 (*)    Chloride 99 (*)    Glucose, Bld 121 (*)    All other components within  normal limits  CBC - Abnormal; Notable for the following:    RBC 5.47 (*)    All other components within normal limits  URINALYSIS, ROUTINE W REFLEX MICROSCOPIC - Abnormal; Notable for the following:    APPearance HAZY (*)    Ketones, ur 5 (*)    Protein, ur 100 (*)    Leukocytes, UA SMALL (*)    Bacteria, UA RARE (*)    Squamous Epithelial / LPF 0-5 (*)    All other components within normal limits  I-STAT BETA HCG BLOOD, ED ([facility], [facility], [facility] ONLY)    EKG  EKG Interpretation  Date/Time:  Tuesday May 22 2017 22:37:33 EDT Ventricular Rate:  72 PR Interval:  148 QRS Duration: 98 QT Interval:  412 QTC Calculation: 451 R Axis:   73 Text Interpretation:  Normal sinus rhythm with sinus arrhythmia Nonspecific T wave abnormality Abnormal ECG No prior for comparison Confirmed by Ross Marcus ([id number]) on 05/23/2017 3:36:01 AM       Radiology Dg Chest 2 View  Result Date: 05/23/2017 CLINICAL DATA:  Cough since Sunday. EXAM: CHEST  2 VIEW COMPARISON:  None. FINDINGS: The heart size and mediastinal contours are within normal limits. Both lungs are clear. The visualized skeletal structures are unremarkable. IMPRESSION: No active cardiopulmonary disease. Electronically Signed   By: Burman Nieves M.D.   On: 05/23/2017 04:21    Procedures Procedures (including critical care time)  Medications Ordered in ED Medications  sodium chloride 0.9 % bolus 1,000 mL (0 mLs Intravenous Stopped 05/23/17 0529)  ibuprofen (ADVIL,MOTRIN) tablet 400 mg (400 mg Oral Given 05/23/17 0456)  benzonatate (TESSALON) capsule 100 mg (100 mg Oral Given 05/23/17 0456)     Initial Impression / Assessment and Plan / ED Course  I have reviewed the triage vital signs and the nursing notes.  Pertinent labs & imaging results that were available during my care of the patient were reviewed by me and considered in my medical decision making (see chart for details).     Patient presents with continued upper  respiratory symptoms.  Nontoxic appearing on exam. Vital signs reassuring. She does report dizziness and with getting orthostatics was dizzy upon standing. She was given 1 L of fluids. Basic labwork obtained and reassuring. Chest x-ray obtained to evaluate for pneumonia. Chest x-ray is negative. Continued supportive measures at home.  After history, exam, and medical workup I feel the patient has been appropriately medically screened and is safe for discharge home. Pertinent diagnoses were discussed with the patient. Patient was given return precautions.   Final Clinical Impressions(s) / ED Diagnoses   Final diagnoses:  Viral URI with cough    New Prescriptions New Prescriptions   BENZONATATE (TESSALON) 100 MG CAPSULE    Take 1 capsule (100 mg total) by mouth every 8 (eight) hours.   IBUPROFEN (ADVIL,MOTRIN) 600 MG  TABLET    Take 1 tablet (600 mg total) by mouth every 6 (six) hours as needed.     Shon Baton, MD 05/23/17 [phone]

## 2017-05-24 LAB — CULTURE, GROUP A STREP (THRC)

## 2017-09-24 IMAGING — US US MFM FETAL BPP W/O NON-STRESS
1 series · 14 of 24 positions shown · non-contrast
Comparison: none

[Series 1: us mfm fetal bpp w/o non-stress · 24 acquisitions, 14 frames shown]
[im 1/24]
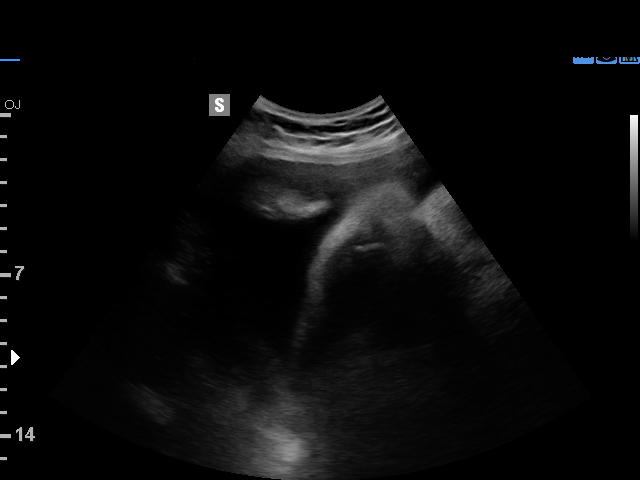
[im 3/24]
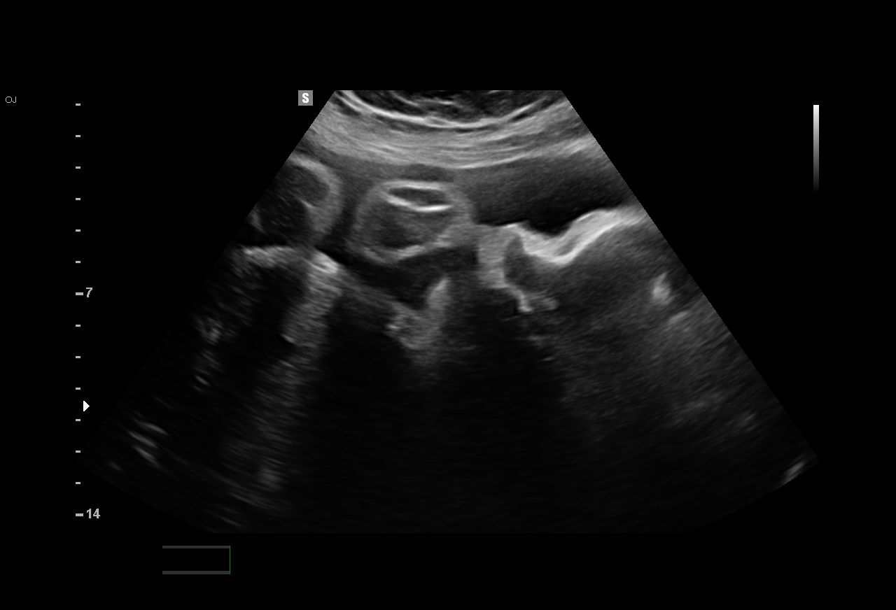
[im 5/24]
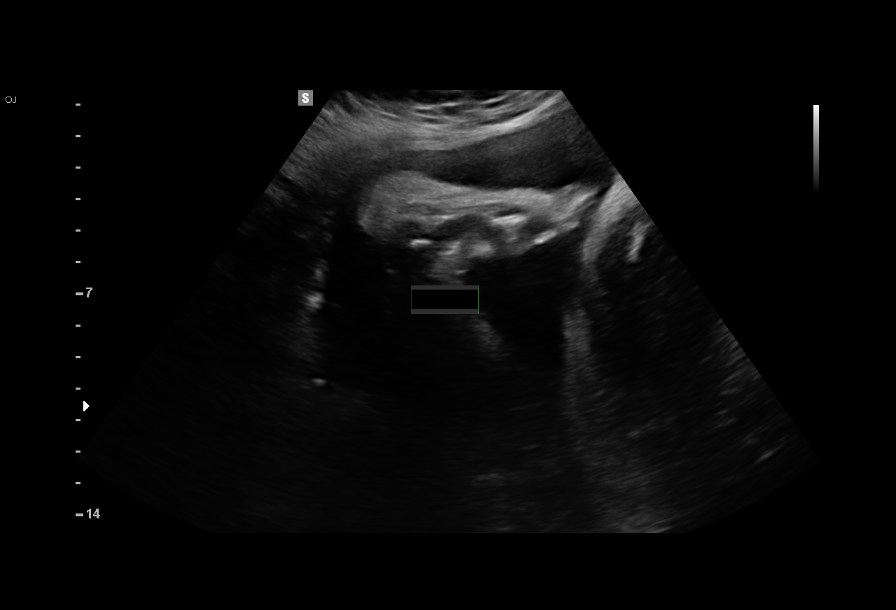
[im 7/24]
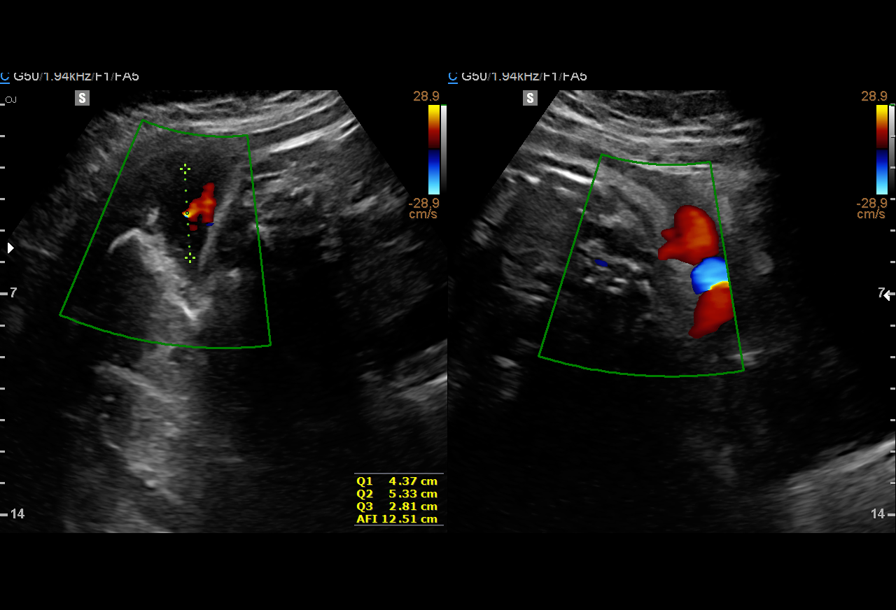
[im 8/24]
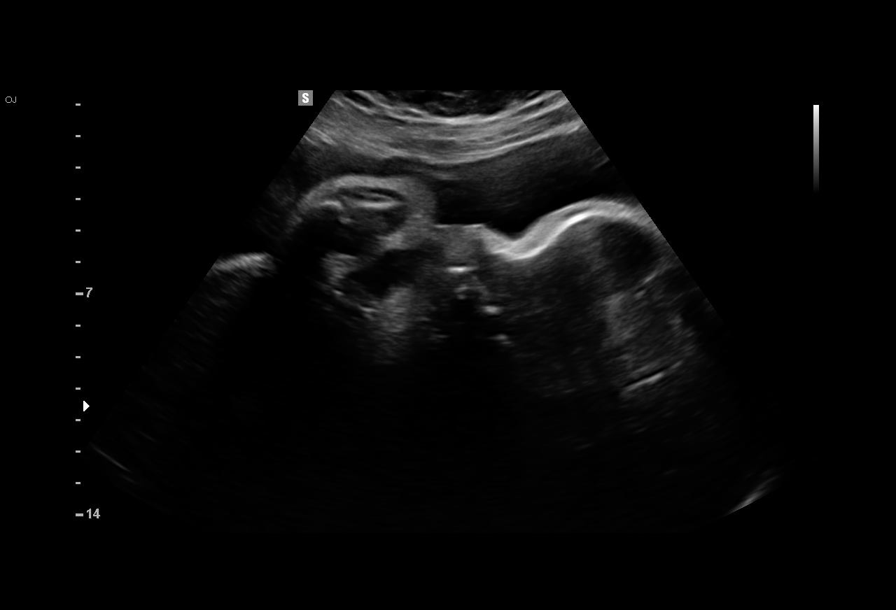
[im 10/24]
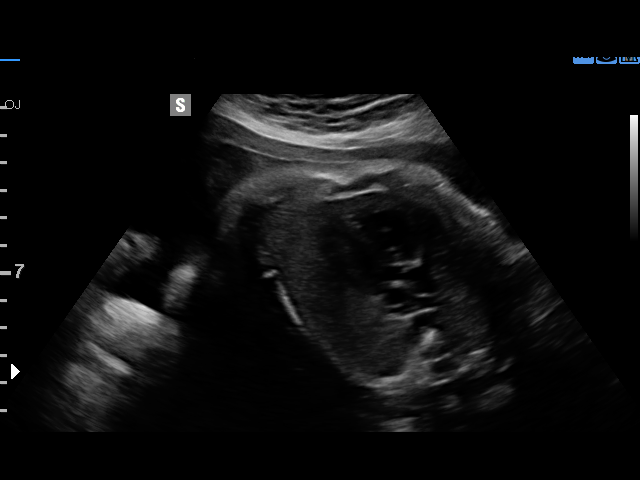
[im 12/24]
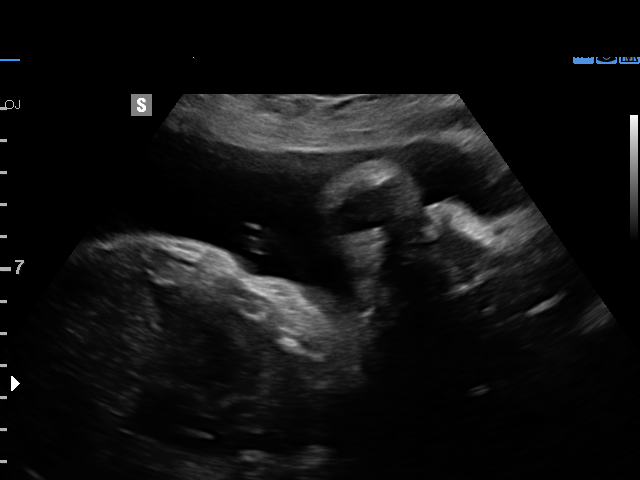
[im 13/24]
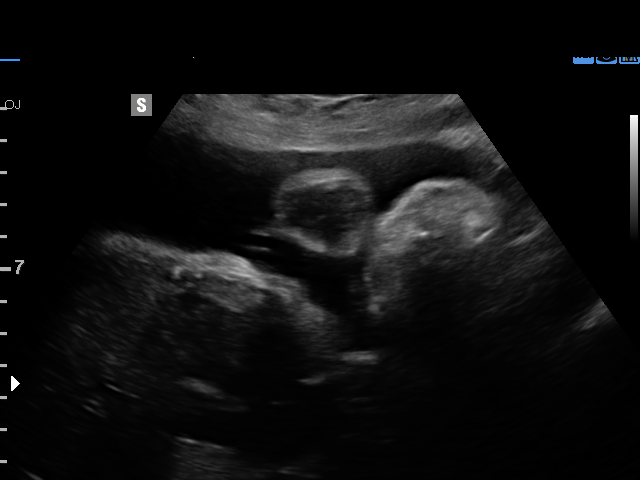
[im 15/24]
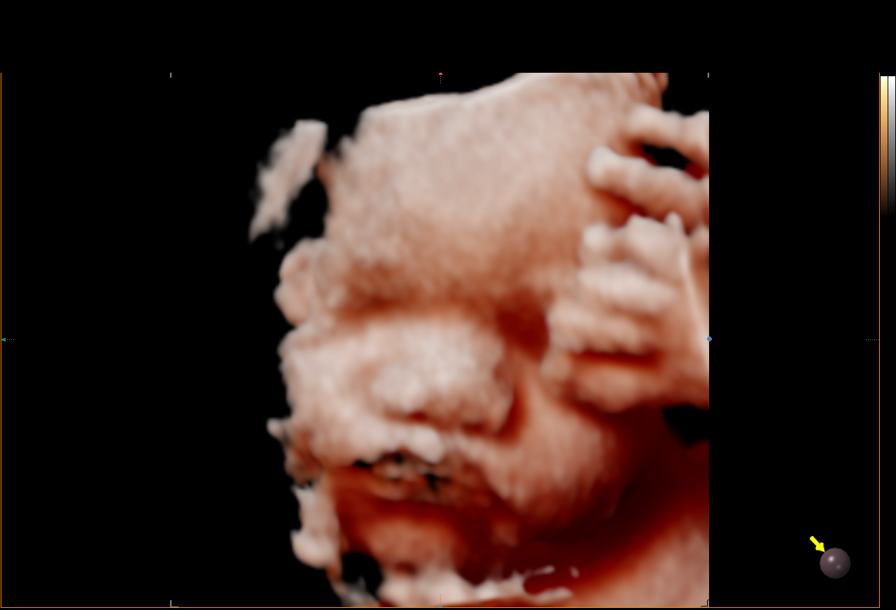
[im 17/24]
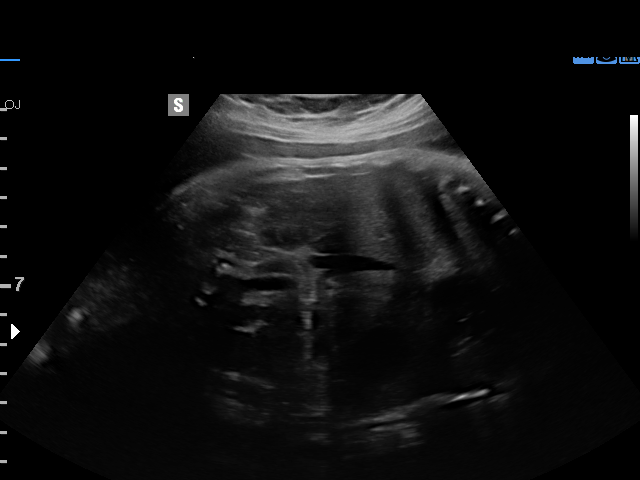
[im 19/24]
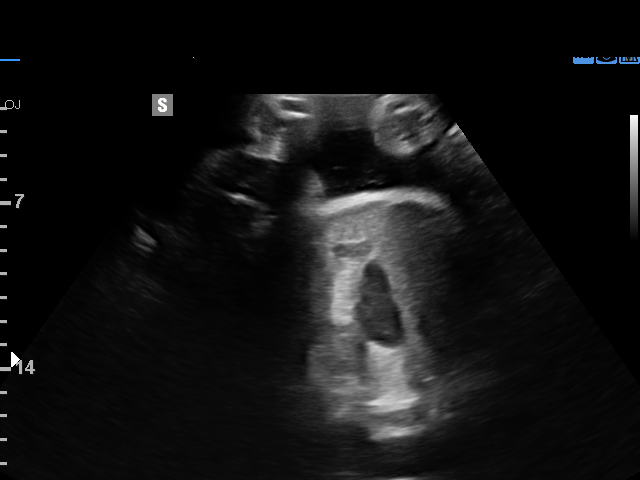
[im 20/24]
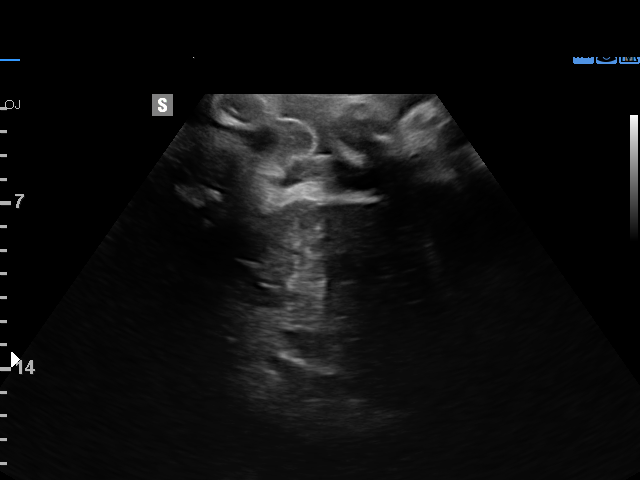
[im 22/24]
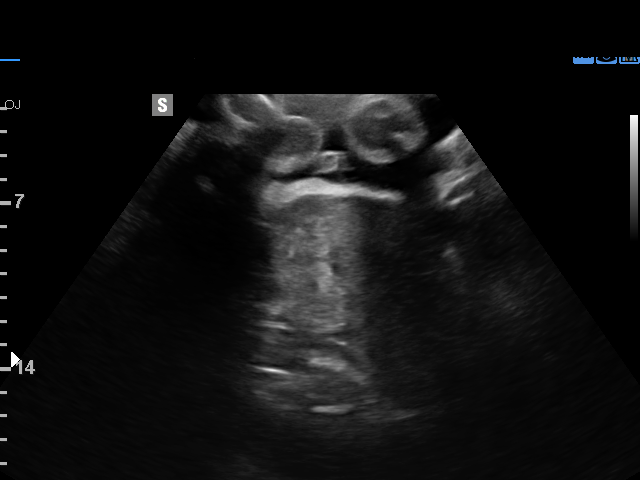
[im 24/24]
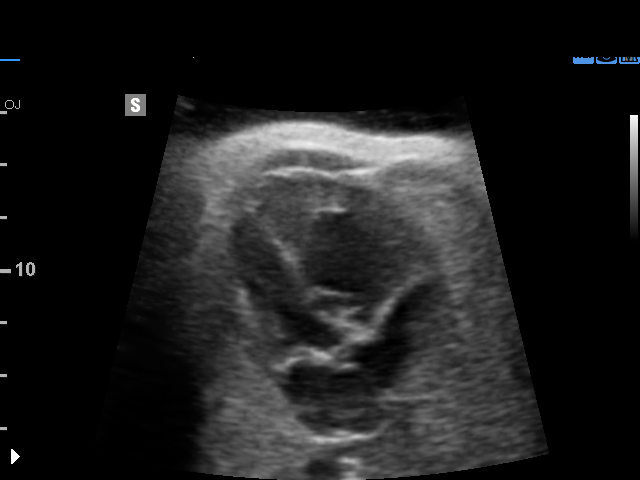

[14 of 24 positions shown; findings below may reference images not displayed]

Road [HOSPITAL]

Indications

37 weeks gestation of pregnancy
Non-reactive NST, FHR decelerations
OB History

Blood Type:            Height:  5'2"   Weight (lb):  202       BMI:
Gravidity:    1         Term:   0        Prem:   0        SAB:   0
TOP:          0       Ectopic:  0        Living: 0
Fetal Evaluation

Num Of Fetuses:     1
Fetal Heart         150
Rate(bpm):
Cardiac Activity:   Observed
Presentation:       Cephalic

Amniotic Fluid
AFI FV:      Subjectively within normal limits

AFI Sum(cm)     %Tile       Largest Pocket(cm)
13.93           52

RUQ(cm)                     LUQ(cm)        LLQ(cm)
5.24
Biophysical Evaluation

Amniotic F.V:   Within normal limits       F. Tone:        Observed
F. Movement:    Observed                   Score:          [DATE]
F. Breathing:   Observed
Gestational Age

LMP:           37w 0d        Date:  06/11/15                 EDD:   03/17/16
Best:          37w 0d     Det. By:  LMP  (06/11/15)          EDD:   03/17/16
Impression

SIUP at 28w1d (remote read of BPP only)
active singleton fetus
BPP [DATE]
Recommendations

Recommend correlation with fetal heart tracing and
management accordingly.

## 2018-12-21 IMAGING — CR DG CHEST 2V
2 series · 2 of 2 positions shown · non-contrast
Comparison: None.

CLINICAL DATA: Cough since [REDACTED].

EXAM:
CHEST  2 VIEW

[chest pa]
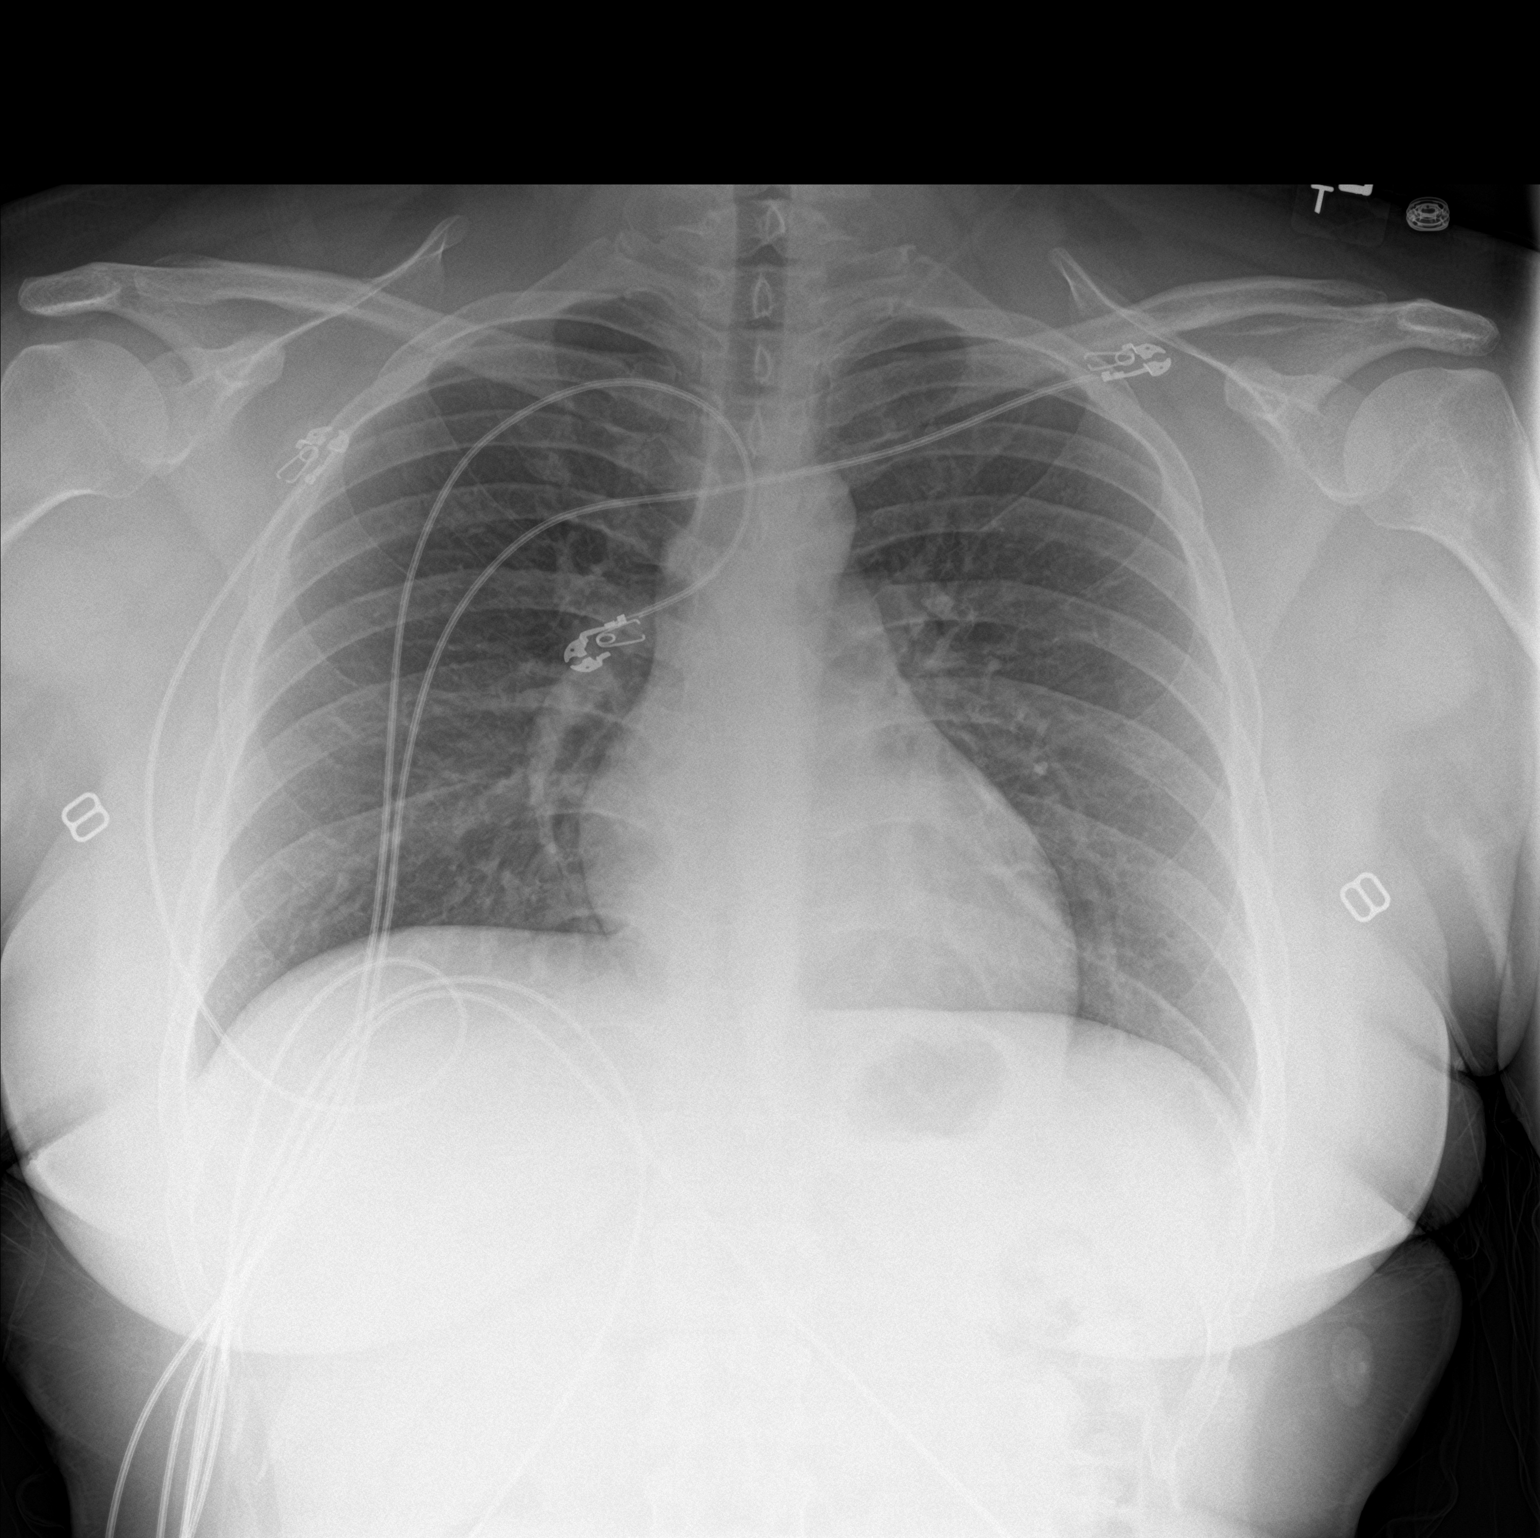

[chest lat]
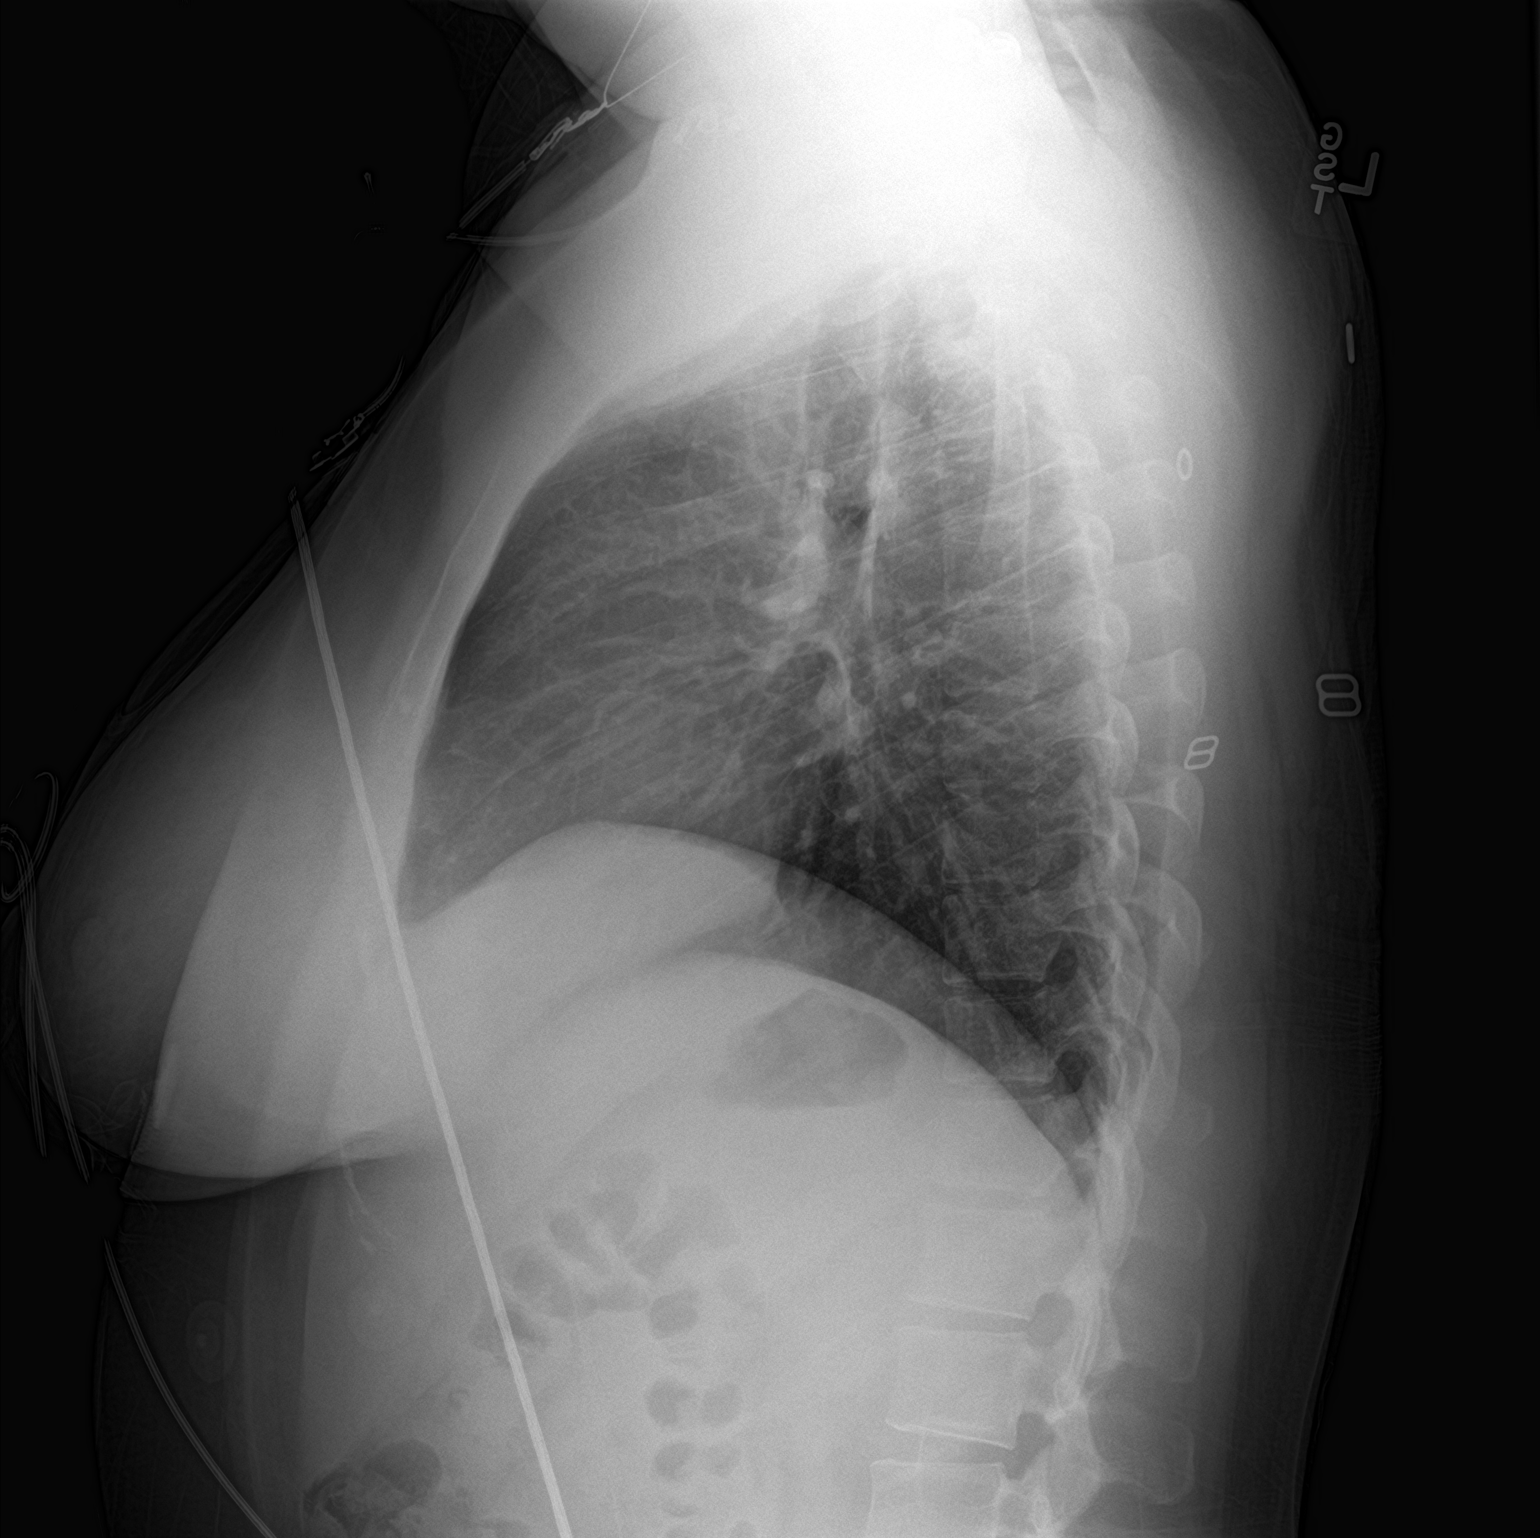

[2 of 2 positions shown; findings below may reference images not displayed]

FINDINGS: The heart size and mediastinal contours are within normal limits.
Both lungs are clear. The visualized skeletal structures are
unremarkable.
IMPRESSION: No active cardiopulmonary disease.

## 2019-06-05 ENCOUNTER — Other Ambulatory Visit: Payer: Self-pay

## 2019-06-05 DIAGNOSIS — Z20822 Contact with and (suspected) exposure to covid-19: Secondary | ICD-10-CM

## 2019-06-06 LAB — NOVEL CORONAVIRUS, NAA: SARS-CoV-2, NAA: NOT DETECTED

## 2019-08-20 ENCOUNTER — Ambulatory Visit
Admission: EM | Admit: 2019-08-20 | Discharge: 2019-08-20 | Disposition: A | Discharge status: Home / Self Care | Payer: Medicaid Other | Attending: Family Medicine | Admitting: Family Medicine

## 2019-08-20 ENCOUNTER — Other Ambulatory Visit: Payer: Self-pay

## 2019-08-20 DIAGNOSIS — M5442 Lumbago with sciatica, left side: Secondary | ICD-10-CM

## 2019-08-20 MED ORDER — PREDNISONE 10 MG PO TABS: ORAL_TABLET | ORAL | 0 refills | Status: DC

## 2019-08-20 NOTE — Discharge Instructions (Signed)
Rest  Medication as prescribed.  Take care  Dr. Tiffanyann Deroo  

## 2019-08-20 NOTE — ED Provider Notes (Signed)
[facility]    CSN: [id number] Arrival date & time: 08/20/19  1333      History   Chief Complaint Chief Complaint  Patient presents with  . Sciatica   HPI  28 year old female presents with low back pain.  1 week history of low back pain which radiates down her left leg.  Some associated numbness.  Worse with sitting down.  Worse with certain activities.  Rates her pain a 7/10 in severity.  No fall, trauma, injury.  No relief with over-the-counter medication.  No saddle anesthesia.  No incontinence.  No other associated symptoms.  No other complaints.  Past Medical History:  Diagnosis Date  . Gestational diabetes    Patient Active Problem List   Diagnosis Date Noted  . Pap smear of vagina with ASC-H 04/20/2016  . History of gestational diabetes 03/07/2016  . Hypertension in pregnancy, preeclampsia, delivered 03/07/2016    Past Surgical History:  Procedure Laterality Date  . KNEE SURGERY      OB History    Gravida  1   Para  1   Term  1   Preterm  0   AB  0   Living  1     SAB  0   TAB  0   Ectopic  0   Multiple  0   Live Births  1            Home Medications    Prior to Admission medications   Medication Sig Start Date End Date Taking? Authorizing Provider  predniSONE (DELTASONE) 10 MG tablet 50 mg daily x 2 days, then 40 mg daily x 2 days, then 30 mg daily x 2 days, then 20 mg daily x 2 days, then 10 mg daily x 2 days. 08/20/19   Coral Spikes, DO    Family History Family History  Problem Relation Age of Onset  . Stroke Father   . Diabetes Father     Social History Social History   Tobacco Use  . Smoking status: Former Smoker    Quit date: 07/16/2015    Years since quitting: 4.0  . Smokeless tobacco: Never Used  Substance Use Topics  . Alcohol use: Not Currently  . Drug use: No   Allergies   Patient has no known allergies.   Review of Systems Review of Systems  Constitutional: Negative.   Musculoskeletal:  Positive for back pain.   Physical Exam Triage Vital Signs ED Triage Vitals  Enc Vitals Group     BP 08/20/19 1358 124/74     Pulse Rate 08/20/19 1358 81     Resp 08/20/19 1358 18     Temp --      Temp src --      SpO2 08/20/19 1358 100 %     Weight 08/20/19 1358 185 lb (83.9 kg)     Height 08/20/19 1358 5\' 1"  (1.549 m)     Head Circumference --      Peak Flow --      Pain Score 08/20/19 1357 7     Pain Loc --      Pain Edu? --      Excl. in [place]? --    Updated Vital Signs BP 124/74 (BP Location: Left Arm)   Pulse 81   Resp 18   Ht 5\' 1"  (1.549 m)   Wt 83.9 kg   LMP 08/06/2019   SpO2 100%   BMI 34.96 kg/m   Visual Acuity Right  Eye Distance:   Left Eye Distance:   Bilateral Distance:    Right Eye Near:   Left Eye Near:    Bilateral Near:     Physical Exam Vitals and nursing note reviewed.  Constitutional:      General: She is not in acute distress.    Appearance: Normal appearance. She is obese. She is not ill-appearing.  HENT:     Head: Normocephalic and atraumatic.  Eyes:     General:        Right eye: No discharge.        Left eye: No discharge.     Conjunctiva/sclera: Conjunctivae normal.  Cardiovascular:     Rate and Rhythm: Normal rate and regular rhythm.     Heart sounds: No murmur.  Pulmonary:     Effort: Pulmonary effort is normal.     Breath sounds: Normal breath sounds. No wheezing, rhonchi or rales.  Musculoskeletal:     Comments: Left low back with paraspinal tenderness to palpation.  Neurological:     Mental Status: She is alert.  Psychiatric:        Mood and Affect: Mood normal.        Behavior: Behavior normal.    UC Treatments / Results  Labs (all labs ordered are listed, but only abnormal results are displayed) Labs Reviewed - No data to display  EKG   Radiology No results found.  Procedures Procedures (including critical care time)  Medications Ordered in UC Medications - No data to display  Initial Impression /  Assessment and Plan / UC Course  I have reviewed the triage vital signs and the nursing notes.  Pertinent labs & imaging results that were available during my care of the patient were reviewed by me and considered in my medical decision making (see chart for details).    28 year old female presents with low back pain.  Prednisone as prescribed.  Supportive care.  Final Clinical Impressions(s) / UC Diagnoses   Final diagnoses:  Acute left-sided low back pain with left-sided sciatica     Discharge Instructions     Rest.  Medication as prescribed.  Take care  Dr. Adriana Simas    ED Prescriptions    Medication Sig Dispense Auth. Provider   predniSONE (DELTASONE) 10 MG tablet 50 mg daily x 2 days, then 40 mg daily x 2 days, then 30 mg daily x 2 days, then 20 mg daily x 2 days, then 10 mg daily x 2 days. 30 tablet Tommie Sams, DO     PDMP not reviewed this encounter.   Tommie Sams, [place] 08/20/19 [phone]

## 2019-08-20 NOTE — ED Triage Notes (Signed)
Pt with pain starting last week in left hip and running down left leg. Sometimes goes numb. Worse when sitting down.

## 2020-05-12 ENCOUNTER — Other Ambulatory Visit: Payer: Self-pay

## 2020-05-12 ENCOUNTER — Encounter (HOSPITAL_COMMUNITY): Payer: Self-pay

## 2020-05-12 ENCOUNTER — Ambulatory Visit (HOSPITAL_COMMUNITY)
Admission: EM | Admit: 2020-05-12 | Discharge: 2020-05-12 | Disposition: A | Discharge status: Home / Self Care | Payer: HRSA Program | Attending: Family Medicine | Admitting: Family Medicine

## 2020-05-12 DIAGNOSIS — U071 COVID-19: Secondary | ICD-10-CM | POA: Diagnosis not present

## 2020-05-12 DIAGNOSIS — R05 Cough: Secondary | ICD-10-CM | POA: Insufficient documentation

## 2020-05-12 DIAGNOSIS — R439 Unspecified disturbances of smell and taste: Secondary | ICD-10-CM | POA: Insufficient documentation

## 2020-05-12 DIAGNOSIS — Z87891 Personal history of nicotine dependence: Secondary | ICD-10-CM | POA: Insufficient documentation

## 2020-05-12 DIAGNOSIS — B349 Viral infection, unspecified: Secondary | ICD-10-CM

## 2020-05-12 DIAGNOSIS — J029 Acute pharyngitis, unspecified: Secondary | ICD-10-CM | POA: Diagnosis not present

## 2020-05-12 LAB — SARS CORONAVIRUS 2 (TAT 6-24 HRS): SARS Coronavirus 2: POSITIVE — AB

## 2020-05-12 NOTE — Discharge Instructions (Signed)
We have tested you for COVID  °Go home and quarantine until we get our results.  °Work note given °You can take OTC medications as needed.  ° °

## 2020-05-12 NOTE — ED Triage Notes (Signed)
Pt reports on Sunday she had a sore throat. Monday Pt started with a productive  Cough. Pt reports she has not any fever,nausea or vomiting.

## 2020-05-12 NOTE — ED Provider Notes (Signed)
[facility]    CSN: [id number] Arrival date & time: 05/12/20  1139      History   Chief Complaint Chief Complaint  Patient presents with  . Cough    HPI Savannah Marsh is a 29 y.o. female.   Patient is a 29 year old female presents today for cough, sore throat, loss of taste smell.  This is been present since Monday.  Family has also been sick with similar symptoms.  Denies any associated fevers, chills.  No specific Covid exposures.     Past Medical History:  Diagnosis Date  . Gestational diabetes     Patient Active Problem List   Diagnosis Date Noted  . Pap smear of vagina with ASC-H 04/20/2016  . History of gestational diabetes 03/07/2016  . Hypertension in pregnancy, preeclampsia, delivered 03/07/2016    Past Surgical History:  Procedure Laterality Date  . KNEE SURGERY      OB History    Gravida  1   Para  1   Term  1   Preterm  0   AB  0   Living  1     SAB  0   TAB  0   Ectopic  0   Multiple  0   Live Births  1            Home Medications    Prior to Admission medications   Not on File    Family History Family History  Problem Relation Age of Onset  . Stroke Father   . Diabetes Father     Social History Social History   Tobacco Use  . Smoking status: Former Smoker    Quit date: 07/16/2015    Years since quitting: 4.8  . Smokeless tobacco: Never Used  Vaping Use  . Vaping Use: Every day  Substance Use Topics  . Alcohol use: Not Currently  . Drug use: No     Allergies   Patient has no known allergies.   Review of Systems Review of Systems   Physical Exam Triage Vital Signs ED Triage Vitals  Enc Vitals Group     BP 05/12/20 1450 139/90     Pulse Rate 05/12/20 1450 66     Resp 05/12/20 1450 18     Temp 05/12/20 1450 98.7 F (37.1 C)     Temp Source 05/12/20 1450 Oral     SpO2 05/12/20 1450 100 %     Weight 05/12/20 1447 203 lb (92.1 kg)     Height 05/12/20 1447 5\' 2"  (1.575 m)     Head  Circumference --      Peak Flow --      Pain Score 05/12/20 1446 0     Pain Loc --      Pain Edu? --      Excl. in GC? --    No data found.  Updated Vital Signs BP 139/90 (BP Location: Right Arm)   Pulse 66   Temp 98.7 F (37.1 C) (Oral)   Resp 18   Ht 5\' 2"  (1.575 m)   Wt 203 lb (92.1 kg)   LMP 04/18/2020   SpO2 100%   BMI 37.13 kg/m   Visual Acuity Right Eye Distance:   Left Eye Distance:   Bilateral Distance:    Right Eye Near:   Left Eye Near:    Bilateral Near:     Physical Exam Vitals and nursing note reviewed.  Constitutional:      General: She is  not in acute distress.    Appearance: Normal appearance. She is not ill-appearing, toxic-appearing or diaphoretic.  HENT:     Head: Normocephalic.     Nose: Nose normal.  Eyes:     Conjunctiva/sclera: Conjunctivae normal.  Cardiovascular:     Rate and Rhythm: Normal rate and regular rhythm.     Heart sounds: Normal heart sounds.  Pulmonary:     Effort: Pulmonary effort is normal.     Breath sounds: Normal breath sounds.  Musculoskeletal:        General: Normal range of motion.     Cervical back: Normal range of motion.  Skin:    General: Skin is warm and dry.     Findings: No rash.  Neurological:     Mental Status: She is alert.  Psychiatric:        Mood and Affect: Mood normal.      UC Treatments / Results  Labs (all labs ordered are listed, but only abnormal results are displayed) Labs Reviewed  SARS CORONAVIRUS 2 (TAT 6-24 HRS)    EKG   Radiology No results found.  Procedures Procedures (including critical care time)  Medications Ordered in UC Medications - No data to display  Initial Impression / Assessment and Plan / UC Course  I have reviewed the triage vital signs and the nursing notes.  Pertinent labs & imaging results that were available during my care of the patient were reviewed by me and considered in my medical decision making (see chart for details).     Viral  illness Covid swab pending.  Over-the-counter medicines as needed.  Work note given. Follow up as needed for continued or worsening symptoms  Final Clinical Impressions(s) / UC Diagnoses   Final diagnoses:  Viral illness     Discharge Instructions     We have tested you for COVID  Go home and quarantine until we get our results.  Work note given You can take OTC medications as needed.      ED Prescriptions    None     PDMP not reviewed this encounter.   Janace Aris, NP 05/12/20 1524

## 2020-05-13 ENCOUNTER — Encounter: Payer: Self-pay | Admitting: Adult Health

## 2020-05-13 ENCOUNTER — Other Ambulatory Visit (HOSPITAL_COMMUNITY): Payer: Self-pay | Admitting: Adult Health

## 2020-05-13 ENCOUNTER — Telehealth (HOSPITAL_COMMUNITY): Payer: Self-pay | Admitting: Adult Health

## 2020-05-13 DIAGNOSIS — U071 COVID-19: Secondary | ICD-10-CM

## 2020-05-13 NOTE — Progress Notes (Signed)
I connected by phone with Savannah Marsh on 05/13/2020 at 5:00 PM to discuss the potential use of a new treatment for mild to moderate COVID-19 viral infection in non-hospitalized patients.  This patient is a 29 y.o. female that meets the FDA criteria for Emergency Use Authorization of COVID monoclonal antibody casirivimab/imdevimab.  Has a (+) direct SARS-CoV-2 viral test result  Has mild or moderate COVID-19   Is NOT hospitalized due to COVID-19  Is within 10 days of symptom onset  Has at least one of the high risk factor(s) for progression to severe COVID-19 and/or hospitalization as defined in EUA.  Specific high risk criteria : BMI > 25   I have spoken and communicated the following to the patient or parent/caregiver regarding COVID monoclonal antibody treatment:  1. FDA has authorized the emergency use for the treatment of mild to moderate COVID-19 in adults and pediatric patients with positive results of direct SARS-CoV-2 viral testing who are 36 years of age and older weighing at least 40 kg, and who are at high risk for progressing to severe COVID-19 and/or hospitalization.  2. The significant known and potential risks and benefits of COVID monoclonal antibody, and the extent to which such potential risks and benefits are unknown.  3. Information on available alternative treatments and the risks and benefits of those alternatives, including clinical trials.  4. Patients treated with COVID monoclonal antibody should continue to self-isolate and use infection control measures (e.g., wear mask, isolate, social distance, avoid sharing personal items, clean and disinfect "high touch" surfaces, and frequent handwashing) according to CDC guidelines.   5. The patient or parent/caregiver has the option to accept or refuse COVID monoclonal antibody treatment.  After reviewing this information with the patient, The patient agreed to proceed with receiving casirivimab\imdevimab infusion and will  be provided a copy of the Fact sheet prior to receiving the infusion. [name] 05/13/2020 5:00 PM

## 2020-05-13 NOTE — Telephone Encounter (Signed)
Called regarding monoclonal antibody treatment for COVID 19 given to those who are at risk for complications and/or hospitalization of the virus.  Patient meets criteria based on: BMI greater than 25.  She is interested in receiving therapy, however she does not have child care for her son.  She is looking into potential solutions and will call us back.    Call back number given: [phone]   Lillard Anes, NP

## 2020-05-14 ENCOUNTER — Ambulatory Visit (HOSPITAL_COMMUNITY)
Admission: RE | Admit: 2020-05-14 | Discharge: 2020-05-14 | Disposition: A | Discharge status: Home / Self Care | Payer: HRSA Program | Source: Ambulatory Visit | Attending: Pulmonary Disease | Admitting: Pulmonary Disease

## 2020-05-14 DIAGNOSIS — U071 COVID-19: Secondary | ICD-10-CM | POA: Diagnosis not present

## 2020-05-14 MED ORDER — SODIUM CHLORIDE 0.9 % IV SOLN
1200.0000 mg | Freq: Once | INTRAVENOUS | Status: AC
  Administered 2020-05-14: 13:00:00 1200 mg via INTRAVENOUS

## 2020-05-14 MED ORDER — FAMOTIDINE IN NACL 20-0.9 MG/50ML-% IV SOLN: 20.0000 mg | Freq: Once | INTRAVENOUS | Status: DC | PRN

## 2020-05-14 MED ORDER — SODIUM CHLORIDE 0.9 % IV SOLN: INTRAVENOUS | Status: DC | PRN

## 2020-05-14 MED ORDER — EPINEPHRINE 0.3 MG/0.3ML IJ SOAJ: 0.3000 mg | Freq: Once | INTRAMUSCULAR | Status: DC | PRN

## 2020-05-14 MED ORDER — DIPHENHYDRAMINE HCL 50 MG/ML IJ SOLN: 50.0000 mg | Freq: Once | INTRAMUSCULAR | Status: DC | PRN

## 2020-05-14 MED ORDER — METHYLPREDNISOLONE SODIUM SUCC 125 MG IJ SOLR: 125.0000 mg | Freq: Once | INTRAMUSCULAR | Status: DC | PRN

## 2020-05-14 MED ORDER — ALBUTEROL SULFATE HFA 108 (90 BASE) MCG/ACT IN AERS: 2.0000 | INHALATION_SPRAY | Freq: Once | RESPIRATORY_TRACT | Status: DC | PRN

## 2020-05-14 NOTE — Discharge Instructions (Signed)

## 2020-05-14 NOTE — Progress Notes (Signed)
  Diagnosis: COVID-19  Physician: Wright, MD  Procedure: [facility] Infusion [facility] Med: casirivimab\imdevimab infusion - Provided patient with casirivimab\imdevimab fact sheet for patients, parents and caregivers prior to infusion.  Complications: No immediate complications noted.  Discharge: Discharged home   Anvita Hirata R Earlyne Feeser 05/14/2020   

## 2024-09-19 ENCOUNTER — Ambulatory Visit (HOSPITAL_BASED_OUTPATIENT_CLINIC_OR_DEPARTMENT_OTHER): Payer: Self-pay
